# Patient Record
Sex: Male | Born: 1955 | Race: White | Hispanic: No | Marital: Married | Smoking: Former smoker
Health system: Southern US, Community
[De-identification: ages and names within clinical notes are randomized; demographics above are authoritative.]

## PROBLEM LIST (undated history)

## (undated) DIAGNOSIS — Z87442 Personal history of urinary calculi: Secondary | ICD-10-CM

## (undated) DIAGNOSIS — Z720 Tobacco use: Secondary | ICD-10-CM

## (undated) DIAGNOSIS — R51 Headache: Secondary | ICD-10-CM

## (undated) DIAGNOSIS — I2510 Atherosclerotic heart disease of native coronary artery without angina pectoris: Secondary | ICD-10-CM

## (undated) DIAGNOSIS — E039 Hypothyroidism, unspecified: Secondary | ICD-10-CM

## (undated) DIAGNOSIS — E785 Hyperlipidemia, unspecified: Secondary | ICD-10-CM

## (undated) DIAGNOSIS — R519 Headache, unspecified: Secondary | ICD-10-CM

## (undated) HISTORY — PX: LIPOMA EXCISION: SHX5283

## (undated) HISTORY — PX: APPENDECTOMY: SHX54

## (undated) HISTORY — DX: Tobacco use: Z72.0

## (undated) HISTORY — DX: Hyperlipidemia, unspecified: E78.5

## (undated) HISTORY — DX: Atherosclerotic heart disease of native coronary artery without angina pectoris: I25.10

---

## 1999-06-13 ENCOUNTER — Emergency Department (HOSPITAL_COMMUNITY): Admission: EM | Admit: 1999-06-13 | Discharge: 1999-06-13 | Payer: Self-pay | Admitting: Emergency Medicine

## 1999-06-13 ENCOUNTER — Encounter: Payer: Self-pay | Admitting: Emergency Medicine

## 2000-04-10 ENCOUNTER — Ambulatory Visit (HOSPITAL_BASED_OUTPATIENT_CLINIC_OR_DEPARTMENT_OTHER): Admission: RE | Admit: 2000-04-10 | Discharge: 2000-04-10 | Payer: Self-pay | Admitting: General Surgery

## 2001-06-29 ENCOUNTER — Encounter: Payer: Self-pay | Admitting: Orthopedic Surgery

## 2001-06-29 ENCOUNTER — Ambulatory Visit (HOSPITAL_COMMUNITY): Admission: RE | Admit: 2001-06-29 | Discharge: 2001-06-29 | Payer: Self-pay | Admitting: Orthopedic Surgery

## 2005-06-27 ENCOUNTER — Encounter: Admission: RE | Admit: 2005-06-27 | Discharge: 2005-06-27 | Payer: Self-pay | Admitting: Surgery

## 2005-06-30 ENCOUNTER — Ambulatory Visit (HOSPITAL_BASED_OUTPATIENT_CLINIC_OR_DEPARTMENT_OTHER): Admission: RE | Admit: 2005-06-30 | Discharge: 2005-06-30 | Payer: Self-pay | Admitting: Surgery

## 2005-06-30 ENCOUNTER — Ambulatory Visit (HOSPITAL_COMMUNITY): Admission: RE | Admit: 2005-06-30 | Discharge: 2005-06-30 | Payer: Self-pay | Admitting: Surgery

## 2013-04-07 ENCOUNTER — Encounter (HOSPITAL_COMMUNITY): Payer: Self-pay | Admitting: *Deleted

## 2013-04-07 ENCOUNTER — Emergency Department (HOSPITAL_COMMUNITY)
Admission: EM | Admit: 2013-04-07 | Discharge: 2013-04-07 | Disposition: A | Discharge status: Home / Self Care | Payer: Self-pay | Attending: Emergency Medicine | Admitting: Emergency Medicine

## 2013-04-07 DIAGNOSIS — L03119 Cellulitis of unspecified part of limb: Secondary | ICD-10-CM | POA: Insufficient documentation

## 2013-04-07 DIAGNOSIS — F17200 Nicotine dependence, unspecified, uncomplicated: Secondary | ICD-10-CM | POA: Insufficient documentation

## 2013-04-07 DIAGNOSIS — Y929 Unspecified place or not applicable: Secondary | ICD-10-CM | POA: Insufficient documentation

## 2013-04-07 DIAGNOSIS — Z91040 Latex allergy status: Secondary | ICD-10-CM | POA: Insufficient documentation

## 2013-04-07 DIAGNOSIS — W57XXXA Bitten or stung by nonvenomous insect and other nonvenomous arthropods, initial encounter: Secondary | ICD-10-CM

## 2013-04-07 DIAGNOSIS — Y939 Activity, unspecified: Secondary | ICD-10-CM | POA: Insufficient documentation

## 2013-04-07 DIAGNOSIS — Z79899 Other long term (current) drug therapy: Secondary | ICD-10-CM | POA: Insufficient documentation

## 2013-04-07 DIAGNOSIS — L02419 Cutaneous abscess of limb, unspecified: Secondary | ICD-10-CM | POA: Insufficient documentation

## 2013-04-07 DIAGNOSIS — S90569A Insect bite (nonvenomous), unspecified ankle, initial encounter: Secondary | ICD-10-CM | POA: Insufficient documentation

## 2013-04-07 MED ORDER — DOXYCYCLINE HYCLATE 100 MG PO CAPS: 100.0000 mg | ORAL_CAPSULE | Freq: Two times a day (BID) | ORAL | Status: DC

## 2013-04-07 MED ORDER — DOXYCYCLINE HYCLATE 100 MG PO TABS
100.0000 mg | ORAL_TABLET | Freq: Once | ORAL | Status: AC
  Administered 2013-04-07: 100 mg via ORAL
  Filled 2013-04-07: qty 1

## 2013-04-07 NOTE — ED Notes (Signed)
Pt states he has an insect bit to the left foot, noticed about 1 week ago & has gotten worse.

## 2013-04-07 NOTE — ED Provider Notes (Signed)
History    This chart was scribed for Bruce Nielsen, MD by Marlyne Beards, ED Scribe. The patient was seen in room APA02/APA02. Patient's care was started at 11:03 PM.    CSN: [id number]  Arrival date & time 04/07/13  2133   First MD Initiated Contact with Patient 04/07/13 2303      Chief Complaint  Patient presents with  . Abscess  . Insect Bite    (Consider location/radiation/quality/duration/timing/severity/associated sxs/prior treatment) The history is provided by the patient. No language interpreter was used.   HPI Comments: Bruce Mcgee is a 57 y.o. male who presents to the Emergency Department complaining of an insect bite on his left ankle that occurred about a week ago. Pt states that he was out on the patio when he believes something bit him. Pt states that nothing was evident until this Tuesday which it was the size of about a penny. The affected area on his left ankle has gotten progressively bigger/worse. Pt states that his shoe might have caused the area to get bigger. He states that wearing his boots seems to be the only thing that exacerbates irritation. Pt denies any trouble breathing/swallowing, fever, chills, cough, nausea, vomiting, diarrhea, SOB, weakness, and any other associated symptoms.   History reviewed. No pertinent past medical history.  Past Surgical History  Procedure Laterality Date  . Appendectomy      No family history on file.  History  Substance Use Topics  . Smoking status: Current Every Day Smoker -- 2.00 packs/day    Types: Cigarettes  . Smokeless tobacco: Not on file  . Alcohol Use: No      Review of Systems  Skin: Positive for rash.  All other systems reviewed and are negative.    Allergies  Latex  Home Medications   Current Outpatient Rx  Name  Route  Sig  Dispense  Refill  . LEVOTHYROXINE SODIUM PO   Oral   Take 1 tablet by mouth daily.           BP 135/80  Pulse 80  Temp(Src) 97.8 F (36.6 C) (Oral)  Resp 18   Ht 5\' 9"  (1.753 m)  Wt 167 lb (75.751 kg)  BMI 24.65 kg/m2  SpO2 99%  Physical Exam  Nursing note and vitals reviewed. Constitutional: He is oriented to person, place, and time. He appears well-developed and well-nourished. No distress.  HENT:  Head: Normocephalic and atraumatic.  Eyes: EOM are normal.  Neck: Neck supple. No tracheal deviation present.  Cardiovascular: Normal rate, regular rhythm, normal heart sounds and intact distal pulses.   Pulmonary/Chest: Effort normal. No respiratory distress.  Musculoskeletal: Normal range of motion.  Neurological: He is alert and oriented to person, place, and time. He has normal reflexes. No cranial nerve deficit. He exhibits normal muscle tone. Coordination normal.  Skin: Skin is warm and dry. Rash noted. There is erythema.  Left foot dorsal aspect with Centralized area of erythema with surrounding ring of erythema but no increased warmth. No fluctuance.   Psychiatric: He has a normal mood and affect. His behavior is normal.    ED Course  Procedures (including critical care time) DIAGNOSTIC STUDIES: Oxygen Saturation is 99% on room air , normal by my interpretation.    COORDINATION OF CARE: 11:09 PM Discussed ED treatment with pt and pt agrees. Prescribed pt with Doxycycline.   Infection precautions verbalized as understood  MDM  Left foot erythema with centralized puncture wound, possible cellulitis, no fluctuance. RX provided. VS and  nursing notes reviewed and considered. No systemic symptoms.   I personally performed the services described in this documentation, which was scribed in my presence. The recorded information has been reviewed and is accurate.        Bruce Nielsen, MD 04/08/13 [phone]

## 2018-03-05 ENCOUNTER — Other Ambulatory Visit: Payer: Self-pay | Admitting: General Surgery

## 2018-03-05 DIAGNOSIS — K432 Incisional hernia without obstruction or gangrene: Secondary | ICD-10-CM

## 2018-03-07 DIAGNOSIS — Z79899 Other long term (current) drug therapy: Secondary | ICD-10-CM | POA: Diagnosis not present

## 2018-03-07 DIAGNOSIS — K432 Incisional hernia without obstruction or gangrene: Principal | ICD-10-CM | POA: Insufficient documentation

## 2018-03-07 DIAGNOSIS — R1032 Left lower quadrant pain: Secondary | ICD-10-CM | POA: Diagnosis present

## 2018-03-07 DIAGNOSIS — F17210 Nicotine dependence, cigarettes, uncomplicated: Secondary | ICD-10-CM | POA: Diagnosis not present

## 2018-03-07 DIAGNOSIS — Z91040 Latex allergy status: Secondary | ICD-10-CM | POA: Diagnosis not present

## 2018-03-07 DIAGNOSIS — D72829 Elevated white blood cell count, unspecified: Secondary | ICD-10-CM | POA: Diagnosis not present

## 2018-03-08 ENCOUNTER — Other Ambulatory Visit: Payer: Self-pay

## 2018-03-08 ENCOUNTER — Emergency Department (HOSPITAL_COMMUNITY): Payer: 59

## 2018-03-08 ENCOUNTER — Observation Stay (HOSPITAL_COMMUNITY)
Admission: EM | Admit: 2018-03-08 | Discharge: 2018-03-09 | Disposition: A | Discharge status: Home / Self Care | Payer: 59 | Attending: General Surgery | Admitting: General Surgery

## 2018-03-08 ENCOUNTER — Encounter (HOSPITAL_COMMUNITY): Payer: Self-pay | Admitting: *Deleted

## 2018-03-08 DIAGNOSIS — K432 Incisional hernia without obstruction or gangrene: Secondary | ICD-10-CM | POA: Diagnosis present

## 2018-03-08 DIAGNOSIS — K439 Ventral hernia without obstruction or gangrene: Secondary | ICD-10-CM

## 2018-03-08 DIAGNOSIS — D72829 Elevated white blood cell count, unspecified: Secondary | ICD-10-CM

## 2018-03-08 LAB — CBC WITH DIFFERENTIAL/PLATELET
BASOS PCT: 0 %
Basophils Absolute: 0 10*3/uL (ref 0.0–0.1)
EOS ABS: 0 10*3/uL (ref 0.0–0.7)
Eosinophils Relative: 0 %
HEMATOCRIT: 47.6 % (ref 39.0–52.0)
HEMOGLOBIN: 16.1 g/dL (ref 13.0–17.0)
LYMPHS ABS: 2.9 10*3/uL (ref 0.7–4.0)
Lymphocytes Relative: 13 %
MCH: 30.3 pg (ref 26.0–34.0)
MCHC: 33.8 g/dL (ref 30.0–36.0)
MCV: 89.6 fL (ref 78.0–100.0)
Monocytes Absolute: 1.4 10*3/uL (ref 0.1–1.0)
Monocytes Relative: 6 %
NEUTROS ABS: 18.4 10*3/uL (ref 1.7–7.7)
NEUTROS PCT: 81 %
Platelets: 272 10*3/uL (ref 150–400)
RBC: 5.31 MIL/uL (ref 4.22–5.81)
RDW: 14.1 % (ref 11.5–15.5)
WBC: 22.8 10*3/uL — AB (ref 4.0–10.5)

## 2018-03-08 LAB — COMPREHENSIVE METABOLIC PANEL
ALT: 29 U/L (ref 17–63)
ANION GAP: 13 (ref 5–15)
AST: 18 U/L (ref 15–41)
Albumin: 3.6 g/dL (ref 3.5–5.0)
Alkaline Phosphatase: 85 U/L (ref 38–126)
BUN: 21 mg/dL — ABNORMAL HIGH (ref 6–20)
CHLORIDE: 107 mmol/L (ref 101–111)
CO2: 24 mmol/L (ref 22–32)
CREATININE: 1.09 mg/dL (ref 0.61–1.24)
Calcium: 8.7 mg/dL — ABNORMAL LOW (ref 8.9–10.3)
Glucose, Bld: 166 mg/dL — ABNORMAL HIGH (ref 65–99)
POTASSIUM: 3.9 mmol/L (ref 3.5–5.1)
Sodium: 144 mmol/L (ref 135–145)
Total Bilirubin: 0.6 mg/dL (ref 0.3–1.2)
Total Protein: 6.8 g/dL (ref 6.5–8.1)

## 2018-03-08 LAB — URINALYSIS, ROUTINE W REFLEX MICROSCOPIC
BILIRUBIN URINE: NEGATIVE
Glucose, UA: NEGATIVE mg/dL
Hgb urine dipstick: NEGATIVE
Ketones, ur: NEGATIVE mg/dL
LEUKOCYTES UA: NEGATIVE
NITRITE: NEGATIVE
PH: 5 (ref 5.0–8.0)
Protein, ur: NEGATIVE mg/dL
Specific Gravity, Urine: 1.031 — ABNORMAL HIGH (ref 1.005–1.030)

## 2018-03-08 LAB — I-STAT CG4 LACTIC ACID, ED: LACTIC ACID, VENOUS: 1.24 mmol/L (ref 0.5–1.9)

## 2018-03-08 LAB — LIPASE, BLOOD: LIPASE: 28 U/L (ref 11–51)

## 2018-03-08 MED ORDER — ACETAMINOPHEN 325 MG PO TABS: 650.0000 mg | ORAL_TABLET | Freq: Four times a day (QID) | ORAL | Status: DC | PRN

## 2018-03-08 MED ORDER — SODIUM CHLORIDE 0.9 % IV BOLUS
1000.0000 mL | Freq: Once | INTRAVENOUS | Status: AC
  Administered 2018-03-08: 1000 mL via INTRAVENOUS

## 2018-03-08 MED ORDER — LEVOTHYROXINE SODIUM 100 MCG PO TABS
100.0000 ug | ORAL_TABLET | Freq: Every day | ORAL | Status: DC
  Administered 2018-03-08: 100 ug via ORAL
  Filled 2018-03-08: qty 1

## 2018-03-08 MED ORDER — ACETAMINOPHEN 650 MG RE SUPP: 650.0000 mg | Freq: Four times a day (QID) | RECTAL | Status: DC | PRN

## 2018-03-08 MED ORDER — HYDROMORPHONE HCL 1 MG/ML IJ SOLN: 1.0000 mg | INTRAMUSCULAR | Status: DC | PRN

## 2018-03-08 MED ORDER — MORPHINE SULFATE (PF) 4 MG/ML IV SOLN
4.0000 mg | Freq: Once | INTRAVENOUS | Status: AC
  Administered 2018-03-08: 4 mg via INTRAVENOUS
  Filled 2018-03-08: qty 1

## 2018-03-08 MED ORDER — LACTATED RINGERS IV SOLN
INTRAVENOUS | Status: DC
  Administered 2018-03-08: 14:00:00 via INTRAVENOUS

## 2018-03-08 MED ORDER — FENTANYL CITRATE (PF) 100 MCG/2ML IJ SOLN
50.0000 ug | Freq: Once | INTRAMUSCULAR | Status: AC
Start: 2018-03-08 — End: 2018-03-08
  Administered 2018-03-08: 50 ug via INTRAVENOUS
  Filled 2018-03-08: qty 2

## 2018-03-08 MED ORDER — ONDANSETRON HCL 4 MG/2ML IJ SOLN: 4.0000 mg | Freq: Four times a day (QID) | INTRAMUSCULAR | Status: DC | PRN

## 2018-03-08 MED ORDER — HYDROMORPHONE HCL 1 MG/ML IJ SOLN
1.0000 mg | Freq: Once | INTRAMUSCULAR | Status: AC
  Administered 2018-03-08: 1 mg via INTRAVENOUS
  Filled 2018-03-08: qty 1

## 2018-03-08 MED ORDER — ONDANSETRON HCL 4 MG/2ML IJ SOLN
4.0000 mg | Freq: Once | INTRAMUSCULAR | Status: AC
  Administered 2018-03-08: 4 mg via INTRAVENOUS
  Filled 2018-03-08: qty 2

## 2018-03-08 MED ORDER — ONDANSETRON 4 MG PO TBDP: 4.0000 mg | ORAL_TABLET | Freq: Four times a day (QID) | ORAL | Status: DC | PRN

## 2018-03-08 MED ORDER — HYDROCODONE-ACETAMINOPHEN 5-325 MG PO TABS: 1.0000 | ORAL_TABLET | ORAL | Status: DC | PRN

## 2018-03-08 MED ORDER — ONDANSETRON HCL 4 MG/2ML IJ SOLN: 4.0000 mg | Freq: Once | INTRAMUSCULAR | Status: DC

## 2018-03-08 MED ORDER — ALBUTEROL SULFATE (2.5 MG/3ML) 0.083% IN NEBU: 3.0000 mL | INHALATION_SOLUTION | RESPIRATORY_TRACT | Status: DC | PRN

## 2018-03-08 MED ORDER — NICOTINE 21 MG/24HR TD PT24
21.0000 mg | MEDICATED_PATCH | Freq: Every day | TRANSDERMAL | Status: DC
  Filled 2018-03-08: qty 1

## 2018-03-08 MED ORDER — SIMETHICONE 80 MG PO CHEW: 40.0000 mg | CHEWABLE_TABLET | Freq: Four times a day (QID) | ORAL | Status: DC | PRN

## 2018-03-08 MED ORDER — ENOXAPARIN SODIUM 40 MG/0.4ML ~~LOC~~ SOLN
40.0000 mg | SUBCUTANEOUS | Status: DC
  Administered 2018-03-08: 40 mg via SUBCUTANEOUS
  Filled 2018-03-08: qty 0.4

## 2018-03-08 MED ORDER — KETOROLAC TROMETHAMINE 30 MG/ML IJ SOLN: 30.0000 mg | Freq: Once | INTRAMUSCULAR | Status: DC

## 2018-03-08 MED ORDER — PIPERACILLIN-TAZOBACTAM 3.375 G IVPB 30 MIN
3.3750 g | Freq: Once | INTRAVENOUS | Status: AC
  Administered 2018-03-08: 3.375 g via INTRAVENOUS
  Filled 2018-03-08: qty 50

## 2018-03-08 NOTE — ED Provider Notes (Addendum)
[facility] EMERGENCY DEPARTMENT Provider Note   CSN: [id number] Arrival date & time: 03/07/18  2358     History   Chief Complaint Chief Complaint  Patient presents with  . Abdominal Pain    HPI Bruce Mcgee is a 62 y.o. male.  The history is provided by the patient.  He had onset about 10 PM of severe pain in the left lower abdomen with some radiation to the right side and radiation to the left flank.  There is associated nausea and vomiting and diaphoresis.  Pain is severe and he rated at a 10/10.  Nothing made it better, nothing made it worse.  He has had some urinary hesitancy but no dysuria and no hematuria.  He denies fever or chills.  He has never had pain like this before.  Of note, he does have a known hernia on the right side of the abdomen and pain does sometimes radiate into the area of the hernia.  He has seen a Careers adviser and is supposed to have surgical repair after he quits smoking.  History reviewed. No pertinent past medical history.  There are no active problems to display for this patient.   Past Surgical History:  Procedure Laterality Date  . APPENDECTOMY          Home Medications    Prior to Admission medications   Medication Sig Start Date End Date Taking? Authorizing Provider  doxycycline (VIBRAMYCIN) 100 MG capsule Take 1 capsule (100 mg total) by mouth 2 (two) times daily. 04/07/13   Sunnie Nielsen, MD  LEVOTHYROXINE SODIUM PO Take 1 tablet by mouth daily.    [provider]    Family History History reviewed. No pertinent family history.  Social History Social History   Tobacco Use  . Smoking status: Current Every Day Smoker    Packs/day: 2.00    Types: Cigarettes  . Smokeless tobacco: Never Used  Substance Use Topics  . Alcohol use: No  . Drug use: No     Allergies   Latex   Review of Systems Review of Systems  All other systems reviewed and are negative.    Physical Exam Updated Vital Signs BP 137/87 (BP  Location: Right Arm)   Pulse 61   Temp 98.1 F (36.7 C) (Oral)   Resp 18   Ht  (1.753 m)   Wt 81.6 kg (180 lb)   SpO2 98%   BMI 26.58 kg/m   Physical Exam  Nursing note and vitals reviewed.  62 year old male, resting comfortably and in no acute distress. Vital signs are normal. Oxygen saturation is 98%, which is normal. Head is normocephalic and atraumatic. PERRLA, EOMI. Oropharynx is clear. Neck is nontender and supple without adenopathy or JVD. Back is nontender in the midline.  There is mild bilateral CVA tenderness. Lungs are clear without rales, wheezes, or rhonchi. Chest is nontender. Heart has regular rate and rhythm without murmur. Abdomen is soft, flat, with moderate tenderness in the left lower quadrant without rebound or guarding.  There is a moderate size abdominal wall hernia in the right midabdomen which is reducible, but does have moderate tenderness.  There are no other masses or hepatosplenomegaly and peristalsis is hypoactive. Extremities have no cyanosis or edema, full range of motion is present. Skin is warm and dry without rash. Neurologic: Mental status is normal, cranial nerves are intact, there are no motor or sensory deficits.  ED Treatments / Results  Labs (all labs ordered are  listed, but only abnormal results are displayed) Labs Reviewed  COMPREHENSIVE METABOLIC PANEL - Abnormal; Notable for the following components:      Result Value   Glucose, Bld 166 (*)    BUN 21 (*)    Calcium 8.7 (*)    All other components within normal limits  URINALYSIS, ROUTINE W REFLEX MICROSCOPIC - Abnormal; Notable for the following components:   Specific Gravity, Urine 1.031 (*)    All other components within normal limits  CBC WITH DIFFERENTIAL/PLATELET - Abnormal; Notable for the following components:   WBC 22.8 (*)    All other components within normal limits  LIPASE, BLOOD  I-STAT CG4 LACTIC ACID, ED  I-STAT CG4 LACTIC ACID, ED   Radiology Ct Renal  Stone Study  Result Date: 03/08/2018 CLINICAL DATA:  Sudden onset nausea and vomiting with left-sided abdominal pain starting tonight. EXAM: CT ABDOMEN AND PELVIS WITHOUT CONTRAST TECHNIQUE: Multidetector CT imaging of the abdomen and pelvis was performed following the standard protocol without IV contrast. COMPARISON:  07/11/2008 FINDINGS: Lower chest: No acute abnormality. Hepatobiliary: No focal liver abnormality is seen. No gallstones, gallbladder wall thickening, or biliary dilatation. Pancreas: Unremarkable. No pancreatic ductal dilatation or surrounding inflammatory changes. Spleen: Normal in size without focal abnormality. Adrenals/Urinary Tract: No adrenal gland nodules. Punctate sized stones in the right kidney. No hydronephrosis or hydroureter. No ureteral stones or bladder stones. No bladder wall thickening. Stomach/Bowel: There is a large right anterior abdominal wall hernia arising through the junction between the rectus abdominus and right flank musculature. There is herniation of right colon, small bowel, and fat. There is prominent fat stranding and vascular engorgement which may indicate fat necrosis and vascular compromise. There is no evidence of proximal obstruction. Stomach, small bowel, and colon are all decompressed. Appendix is not identified but is likely within the hernia. Vascular/Lymphatic: Aortic atherosclerosis. No enlarged abdominal or pelvic lymph nodes. Reproductive: Prostate is unremarkable. Other: No free air or free fluid in the abdomen. Musculoskeletal: No acute or significant osseous findings. IMPRESSION: 1. Large right anterolateral hernia arising between the rectus abdominus and right flank muscles. There is herniation of large amounts of small bowel, right colon, and fat. Fat stranding and vascular engorgement suggests fat necrosis and vascular compromise. No bowel wall thickening or obstruction. 2. Tiny nonobstructing stones in the right kidney. No ureteral stone or  obstruction. Electronically Signed   By: Burman Nieves M.D.   On: 03/08/2018 02:30    Procedures Procedures   Medications Ordered in ED Medications  piperacillin-tazobactam (ZOSYN) IVPB 3.375 g (has no administration in time range)  ondansetron (ZOFRAN) injection 4 mg (4 mg Intravenous Given 03/08/18 0036)  fentaNYL (SUBLIMAZE) injection 50 mcg (50 mcg Intravenous Given 03/08/18 0114)  sodium chloride 0.9 % bolus 1,000 mL (0 mLs Intravenous Stopped 03/08/18 0323)  morphine 4 MG/ML injection 4 mg (4 mg Intravenous Given 03/08/18 0134)  morphine 4 MG/ML injection 4 mg (4 mg Intravenous Given 03/08/18 0206)  HYDROmorphone (DILAUDID) injection 1 mg (1 mg Intravenous Given 03/08/18 0323)     Initial Impression / Assessment and Plan / ED Course  I have reviewed the triage vital signs and the nursing notes.  Pertinent labs & imaging results that were available during my care of the patient were reviewed by me and considered in my medical decision making (see chart for details).  Abdominal and flank pain worrisome for urolithiasis.  Other diagnostic possibilities include diverticulitis, urinary tract infection.  Abdominal wall hernia without signs of incarceration or  strangulation.  Old records are reviewed, and he has no relevant past visits.  He will be sent for renal stone protocol CT scan.  Other screening labs are also obtained.  He had received a dose of fentanyl with good relief of pain, but pain is recurring.  He will be given morphine.  He required several doses of morphine and continued to have pain.  He is given hydromorphone with good relief of pain.  CT scan shows no evidence of urolithiasis, but large right-sided hernia with fat stranding and vascular engorgement suggesting fat necrosis and vascular compromise.  Lactic acid was obtained and was normal, so there is no evidence of acute ischemic bowel.  Case is discussed with Dr. Lovell Sheehan who agrees to come to evaluate the patient.  Dr.  Lovell Sheehan has seen the patient and agrees that he needs somewhat urgent repair of his hernia, but does not feel that it can be done safely here.  He had seen a Development worker, international aid at [facility] surgery 3 days ago, and they will be contacted to see if they would be willing to accept the patient in transfer.  If there unable to take the patient in transfer, he will need to be transferred to [facility].  Care is signed out to Dr. Jodi Mourning at this point.  Although this does not appear to be an infectious event, it is felt prudent to start him on antibiotics and he is given a dose of intravenous Zosyn.  Final Clinical Impressions(s) / ED Diagnoses   Final diagnoses:  Abdominal wall hernia  Leukocytosis, unspecified type    ED Discharge Orders    None       Dione Booze, MD 03/08/18 [phone]   Case has been discussed with Dr. Harlon Flor of [facility] surgery who states that there are no beds available at [facility], and recommends patient be transferred to [facility].   Dione Booze, MD 03/08/18 [phone]

## 2018-03-08 NOTE — ED Triage Notes (Signed)
Pt brought in by [facility] for c/o left side abdominal pain that started x 3 hours ago; pt states he has been vomiting

## 2018-03-08 NOTE — H&P (Signed)
Reason for Consult: Incisional hernia Referring Physician: Dr. Domingo Madeira is an 62 y.o. male.  HPI: Patient is a 62 year old white male who presented to the emergency room with worsening abdominal pain and nausea.  He denies any emesis.  He denies any fever or chills.  He started having swelling and pain along the right side of the abdomen over the known ventral hernia.  Is been present for approximately 10 years.  He is status post an open appendectomy in the remote past.  He was seen by a surgeon at [facility] 3 days ago and a CT scan of the abdomen was ordered.  Due to worsening pain, the patient presented to the emergency room.  Patient was noted to have a significant leukocytosis.  His pain was intermittently controlled with IV pain medication.  CT scan of the abdomen pelvis revealed small bowel in the right colon to be within the hernia.  The hernia was reduced by the emergency room physician, but the patient has continued to have intermittent pain.  The hernia bulges back out soon after reduction.  Patient has a pain level of 8 out of 10 when the hernia sticks out.  He is currently taking prednisone for allergies.  History reviewed. No pertinent past medical history.  Past Surgical History:  Procedure Laterality Date  . APPENDECTOMY      History reviewed. No pertinent family history.  Social History:  reports that he has been smoking cigarettes.  He has been smoking about 2.00 packs per day. He has never used smokeless tobacco. He reports that he does not drink alcohol or use drugs.  Allergies:  Allergies  Allergen Reactions  . Latex     Medications: I have reviewed the patient's current medications.  Results for orders placed or performed during the hospital encounter of 03/08/18 (from the past 48 hour(s))  Urinalysis, Routine w reflex microscopic     Status: Abnormal   Collection Time: 03/08/18 12:11 AM  Result Value Ref Range   Color, Urine YELLOW  YELLOW   APPearance CLEAR CLEAR   Specific Gravity, Urine 1.031 (H) 1.005 - 1.030   pH 5.0 5.0 - 8.0   Glucose, UA NEGATIVE NEGATIVE mg/dL   Hgb urine dipstick NEGATIVE NEGATIVE   Bilirubin Urine NEGATIVE NEGATIVE   Ketones, ur NEGATIVE NEGATIVE mg/dL   Protein, ur NEGATIVE NEGATIVE mg/dL   Nitrite NEGATIVE NEGATIVE   Leukocytes, UA NEGATIVE NEGATIVE    Comment: Performed at [facility], [address] [place], [place] [address]  Lipase, blood     Status: None   Collection Time: 03/08/18 12:52 AM  Result Value Ref Range   Lipase 28 11 - 51 U/L    Comment: Performed at [facility], [address], [place],  [address]  Comprehensive metabolic panel     Status: Abnormal   Collection Time: 03/08/18 12:52 AM  Result Value Ref Range   Sodium 144 135 - 145 mmol/L   Potassium 3.9 3.5 - 5.1 mmol/L   Chloride 107 101 - 111 mmol/L   CO2 24 22 - 32 mmol/L   Glucose, Bld 166 (H) 65 - 99 mg/dL   BUN 21 (H) 6 - 20 mg/dL   Creatinine, Ser 1.09 0.61 - 1.24 mg/dL   Calcium 8.7 (L) 8.9 - 10.3 mg/dL   Total Protein 6.8 6.5 - 8.1 g/dL   Albumin 3.6 3.5 - 5.0 g/dL   AST 18 15 - 41 U/L   ALT 29 17 -  63 U/L   Alkaline Phosphatase 85 38 - 126 U/L   Total Bilirubin 0.6 0.3 - 1.2 mg/dL   GFR calc non Af Amer >60 >60 mL/min   GFR calc Af Amer >60 >60 mL/min    Comment: (NOTE) The eGFR has been calculated using the CKD EPI equation. This calculation has not been validated in all clinical situations. eGFR's persistently <60 mL/min signify possible Chronic Kidney Disease.    Anion gap 13 5 - 15    Comment: Performed at [facility], [address], [place], [place] [address]  CBC with Differential     Status: Abnormal   Collection Time: 03/08/18 12:52 AM  Result Value Ref Range   WBC 22.8 (H) 4.0 - 10.5 K/uL   RBC 5.31 4.22 - 5.81 MIL/uL   Hemoglobin 16.1 13.0 - 17.0 g/dL   HCT 47.6 39.0 - 52.0 %   MCV 89.6 78.0 - 100.0 fL   MCH 30.3 26.0 - 34.0 pg   MCHC 33.8 30.0 - 36.0 g/dL   RDW  14.1 11.5 - 15.5 %   Platelets 272 150 - 400 K/uL   Neutrophils Relative % 81 %   Neutro Abs 18.4 1.7 - 7.7 K/uL   Lymphocytes Relative 13 %   Lymphs Abs 2.9 0.7 - 4.0 K/uL   Monocytes Relative 6 %   Monocytes Absolute 1.4 0.1 - 1.0 K/uL   Eosinophils Relative 0 %   Eosinophils Absolute 0.0 0.0 - 0.7 K/uL   Basophils Relative 0 %   Basophils Absolute 0.0 0.0 - 0.1 K/uL    Comment: Performed at [facility], [address], [place], [place] [address]  I-Stat CG4 Lactic Acid, ED     Status: None   Collection Time: 03/08/18  2:50 AM  Result Value Ref Range   Lactic Acid, Venous 1.24 0.5 - 1.9 mmol/L    Ct Renal Stone Study  Result Date: 03/08/2018 CLINICAL DATA:  Sudden onset nausea and vomiting with left-sided abdominal pain starting tonight. EXAM: CT ABDOMEN AND PELVIS WITHOUT CONTRAST TECHNIQUE: Multidetector CT imaging of the abdomen and pelvis was performed following the standard protocol without IV contrast. COMPARISON:  07/11/2008 FINDINGS: Lower chest: No acute abnormality. Hepatobiliary: No focal liver abnormality is seen. No gallstones, gallbladder wall thickening, or biliary dilatation. Pancreas: Unremarkable. No pancreatic ductal dilatation or surrounding inflammatory changes. Spleen: Normal in size without focal abnormality. Adrenals/Urinary Tract: No adrenal gland nodules. Punctate sized stones in the right kidney. No hydronephrosis or hydroureter. No ureteral stones or bladder stones. No bladder wall thickening. Stomach/Bowel: There is a large right anterior abdominal wall hernia arising through the junction between the rectus abdominus and right flank musculature. There is herniation of right colon, small bowel, and fat. There is prominent fat stranding and vascular engorgement which may indicate fat necrosis and vascular compromise. There is no evidence of proximal obstruction. Stomach, small bowel, and colon are all decompressed. Appendix is not identified but is likely within the  hernia. Vascular/Lymphatic: Aortic atherosclerosis. No enlarged abdominal or pelvic lymph nodes. Reproductive: Prostate is unremarkable. Other: No free air or free fluid in the abdomen. Musculoskeletal: No acute or significant osseous findings. IMPRESSION: 1. Large right anterolateral hernia arising between the rectus abdominus and right flank muscles. There is herniation of large amounts of small bowel, right colon, and fat. Fat stranding and vascular engorgement suggests fat necrosis and vascular compromise. No bowel wall thickening or obstruction. 2. Tiny nonobstructing stones in the right kidney. No ureteral stone or obstruction.  Electronically Signed   By: Lucienne Capers M.D.   On: 03/08/2018 02:30    ROS:  Pertinent items are noted in HPI.  Blood pressure (!) 142/78, pulse 84, temperature 98.2 F (36.8 C), temperature source Oral, resp. rate 18, height 5' 9"  (1.753 m), weight 180 lb (81.6 kg), SpO2 92 %. Physical Exam: Pleasant well-developed well-nourished white male no acute distress Head is normocephalic, atraumatic Lungs are clear to auscultation with equal breath sounds bilaterally.  No wheezing is noted. Heart examination reveals regular rate and rhythm without S3, S4, murmurs The abdomen is soft with a transverse right lower quadrant surgical scar present which is indented.  A large reducible hernia is present.  No rigidity is noted.  CT scan images personally reviewed  Assessment/Plan: Impression: Incisional hernia.  It is concerning that the patient's white blood cell count is elevated.  The does not seem to be evidence of strangulation, though there was some venous congestion on the CT scan.  This is a complex hernia.  I do not think this is a hernia that can be repaired at [facility].  As patient just saw [facility] several days ago, Dr. Roxanne Mins will be contacting them.  Aviva Signs 03/08/2018, 8:47 AM   Addendum:  Both [facility] and [facility] hospitals have  been contacted and are full and cannot accept the patient in transfer.  We will bring the patient into the hospital for observation status due to the leukocytosis.  If he does fine over the next 24 hours, he will be discharged and set up for outpatient visits.

## 2018-03-08 NOTE — ED Notes (Signed)
Checked with pt for urine sample,pt states can't go right now will try back in 30 minutes. 

## 2018-03-08 NOTE — ED Notes (Addendum)
Pt c/o sudden onset of n/v, left side abd and left side flank pain that started tonight, denies any diarrhea, denies any exposure to illness.

## 2018-03-08 NOTE — ED Notes (Signed)
Pt states that his pain is starting to come back stronger again, pt requesting additional pain medication Dr Preston Fleeting notified,

## 2018-03-08 NOTE — ED Notes (Signed)
Pt requesting something for nausea, zofran given per protocol.

## 2018-03-08 NOTE — ED Notes (Signed)
Pt ambulated to BR

## 2018-03-08 NOTE — Consult Note (Addendum)
Reason for Consult: Incisional hernia Referring Physician: Dr. Domingo Madeira is an 62 y.o. male.  HPI: Patient is a 62 year old white male who presented to the emergency room with worsening abdominal pain and nausea.  He denies any emesis.  He denies any fever or chills.  He started having swelling and pain along the right side of the abdomen over the known ventral hernia.  Is been present for approximately 10 years.  He is status post an open appendectomy in the remote past.  He was seen by a surgeon at [facility] 3 days ago and a CT scan of the abdomen was ordered.  Due to worsening pain, the patient presented to the emergency room.  Patient was noted to have a significant leukocytosis.  His pain was intermittently controlled with IV pain medication.  CT scan of the abdomen pelvis revealed small bowel in the right colon to be within the hernia.  The hernia was reduced by the emergency room physician, but the patient has continued to have intermittent pain.  The hernia bulges back out soon after reduction.  Patient has a pain level of 8 out of 10 when the hernia sticks out.  He is currently taking prednisone for allergies.  History reviewed. No pertinent past medical history.  Past Surgical History:  Procedure Laterality Date  . APPENDECTOMY      History reviewed. No pertinent family history.  Social History:  reports that he has been smoking cigarettes.  He has been smoking about 2.00 packs per day. He has never used smokeless tobacco. He reports that he does not drink alcohol or use drugs.  Allergies:  Allergies  Allergen Reactions  . Latex     Medications: I have reviewed the patient's current medications.  Results for orders placed or performed during the hospital encounter of 03/08/18 (from the past 48 hour(s))  Urinalysis, Routine w reflex microscopic     Status: Abnormal   Collection Time: 03/08/18 12:11 AM  Result Value Ref Range   Color, Urine YELLOW  YELLOW   APPearance CLEAR CLEAR   Specific Gravity, Urine 1.031 (H) 1.005 - 1.030   pH 5.0 5.0 - 8.0   Glucose, UA NEGATIVE NEGATIVE mg/dL   Hgb urine dipstick NEGATIVE NEGATIVE   Bilirubin Urine NEGATIVE NEGATIVE   Ketones, ur NEGATIVE NEGATIVE mg/dL   Protein, ur NEGATIVE NEGATIVE mg/dL   Nitrite NEGATIVE NEGATIVE   Leukocytes, UA NEGATIVE NEGATIVE    Comment: Performed at [facility], [address], [place], [place] [address]  Lipase, blood     Status: None   Collection Time: 03/08/18 12:52 AM  Result Value Ref Range   Lipase 28 11 - 51 U/L    Comment: Performed at [facility], [address], [place], [place] [address]  Comprehensive metabolic panel     Status: Abnormal   Collection Time: 03/08/18 12:52 AM  Result Value Ref Range   Sodium 144 135 - 145 mmol/L   Potassium 3.9 3.5 - 5.1 mmol/L   Chloride 107 101 - 111 mmol/L   CO2 24 22 - 32 mmol/L   Glucose, Bld 166 (H) 65 - 99 mg/dL   BUN 21 (H) 6 - 20 mg/dL   Creatinine, Ser 1.09 0.61 - 1.24 mg/dL   Calcium 8.7 (L) 8.9 - 10.3 mg/dL   Total Protein 6.8 6.5 - 8.1 g/dL   Albumin 3.6 3.5 - 5.0 g/dL   AST 18 15 - 41 U/L   ALT 29 17 -  63 U/L   Alkaline Phosphatase 85 38 - 126 U/L   Total Bilirubin 0.6 0.3 - 1.2 mg/dL   GFR calc non Af Amer >60 >60 mL/min   GFR calc Af Amer >60 >60 mL/min    Comment: (NOTE) The eGFR has been calculated using the CKD EPI equation. This calculation has not been validated in all clinical situations. eGFR's persistently <60 mL/min signify possible Chronic Kidney Disease.    Anion gap 13 5 - 15    Comment: Performed at [facility], [address] [place], [place] [address]  CBC with Differential     Status: Abnormal   Collection Time: 03/08/18 12:52 AM  Result Value Ref Range   WBC 22.8 (H) 4.0 - 10.5 K/uL   RBC 5.31 4.22 - 5.81 MIL/uL   Hemoglobin 16.1 13.0 - 17.0 g/dL   HCT 47.6 39.0 - 52.0 %   MCV 89.6 78.0 - 100.0 fL   MCH 30.3 26.0 - 34.0 pg   MCHC 33.8 30.0 - 36.0 g/dL   RDW  14.1 11.5 - 15.5 %   Platelets 272 150 - 400 K/uL   Neutrophils Relative % 81 %   Neutro Abs 18.4 1.7 - 7.7 K/uL   Lymphocytes Relative 13 %   Lymphs Abs 2.9 0.7 - 4.0 K/uL   Monocytes Relative 6 %   Monocytes Absolute 1.4 0.1 - 1.0 K/uL   Eosinophils Relative 0 %   Eosinophils Absolute 0.0 0.0 - 0.7 K/uL   Basophils Relative 0 %   Basophils Absolute 0.0 0.0 - 0.1 K/uL    Comment: Performed at [facility], [address] [place], [place] [address]  I-Stat CG4 Lactic Acid, ED     Status: None   Collection Time: 03/08/18  2:50 AM  Result Value Ref Range   Lactic Acid, Venous 1.24 0.5 - 1.9 mmol/L    Ct Renal Stone Study  Result Date: 03/08/2018 CLINICAL DATA:  Sudden onset nausea and vomiting with left-sided abdominal pain starting tonight. EXAM: CT ABDOMEN AND PELVIS WITHOUT CONTRAST TECHNIQUE: Multidetector CT imaging of the abdomen and pelvis was performed following the standard protocol without IV contrast. COMPARISON:  07/11/2008 FINDINGS: Lower chest: No acute abnormality. Hepatobiliary: No focal liver abnormality is seen. No gallstones, gallbladder wall thickening, or biliary dilatation. Pancreas: Unremarkable. No pancreatic ductal dilatation or surrounding inflammatory changes. Spleen: Normal in size without focal abnormality. Adrenals/Urinary Tract: No adrenal gland nodules. Punctate sized stones in the right kidney. No hydronephrosis or hydroureter. No ureteral stones or bladder stones. No bladder wall thickening. Stomach/Bowel: There is a large right anterior abdominal wall hernia arising through the junction between the rectus abdominus and right flank musculature. There is herniation of right colon, small bowel, and fat. There is prominent fat stranding and vascular engorgement which may indicate fat necrosis and vascular compromise. There is no evidence of proximal obstruction. Stomach, small bowel, and colon are all decompressed. Appendix is not identified but is likely within the  hernia. Vascular/Lymphatic: Aortic atherosclerosis. No enlarged abdominal or pelvic lymph nodes. Reproductive: Prostate is unremarkable. Other: No free air or free fluid in the abdomen. Musculoskeletal: No acute or significant osseous findings. IMPRESSION: 1. Large right anterolateral hernia arising between the rectus abdominus and right flank muscles. There is herniation of large amounts of small bowel, right colon, and fat. Fat stranding and vascular engorgement suggests fat necrosis and vascular compromise. No bowel wall thickening or obstruction. 2. Tiny nonobstructing stones in the right kidney. No ureteral stone or obstruction.  Electronically Signed   By: Lucienne Capers M.D.   On: 03/08/2018 02:30    ROS:  Pertinent items are noted in HPI.  Blood pressure (!) 142/78, pulse 84, temperature 98.2 F (36.8 C), temperature source Oral, resp. rate 18, height 5' 9"  (1.753 m), weight 180 lb (81.6 kg), SpO2 92 %. Physical Exam: Pleasant well-developed well-nourished white male no acute distress Head is normocephalic, atraumatic Lungs are clear to auscultation with equal breath sounds bilaterally.  No wheezing is noted. Heart examination reveals regular rate and rhythm without S3, S4, murmurs The abdomen is soft with a transverse right lower quadrant surgical scar present which is indented.  A large reducible hernia is present.  No rigidity is noted.  CT scan images personally reviewed  Assessment/Plan: Impression: Incisional hernia.  It is concerning that the patient's white blood cell count is elevated.  The does not seem to be evidence of strangulation, though there was some venous congestion on the CT scan.  This is a complex hernia.  I do not think this is a hernia that can be repaired at [facility].  As patient just saw [facility] several days ago, Dr. Roxanne Mins will be contacting them.  Aviva Signs 03/08/2018, 8:47 AM   Addendum:  Both [facility] and [facility] hospitals have  been contacted and are full and cannot accept the patient in transfer.  We will bring the patient into the hospital for observation status due to the leukocytosis.  If he does fine over the next 24 hours, he will be discharged and set up for outpatient visits.

## 2018-03-08 NOTE — ED Notes (Signed)
Dr. Jenkins at bedside. 

## 2018-03-08 NOTE — ED Notes (Signed)
Patient transported to CT 

## 2018-03-09 DIAGNOSIS — K439 Ventral hernia without obstruction or gangrene: Secondary | ICD-10-CM | POA: Diagnosis not present

## 2018-03-09 LAB — CBC
HCT: 45.4 % (ref 39.0–52.0)
HEMOGLOBIN: 14.2 g/dL (ref 13.0–17.0)
MCH: 28.5 pg (ref 26.0–34.0)
MCHC: 31.3 g/dL (ref 30.0–36.0)
MCV: 91.2 fL (ref 78.0–100.0)
Platelets: 266 10*3/uL (ref 150–400)
RBC: 4.98 MIL/uL (ref 4.22–5.81)
RDW: 14.4 % (ref 11.5–15.5)
WBC: 16.1 10*3/uL — ABNORMAL HIGH (ref 4.0–10.5)

## 2018-03-09 LAB — BASIC METABOLIC PANEL
ANION GAP: 10 (ref 5–15)
BUN: 14 mg/dL (ref 6–20)
CHLORIDE: 101 mmol/L (ref 101–111)
CO2: 27 mmol/L (ref 22–32)
Calcium: 8.2 mg/dL — ABNORMAL LOW (ref 8.9–10.3)
Creatinine, Ser: 0.99 mg/dL (ref 0.61–1.24)
GFR calc non Af Amer: >60 mL/min (ref >60)
GLUCOSE: 112 mg/dL — AB (ref 65–99)
Potassium: 3.4 mmol/L — ABNORMAL LOW (ref 3.5–5.1)
Sodium: 138 mmol/L (ref 135–145)

## 2018-03-09 LAB — HIV ANTIBODY (ROUTINE TESTING W REFLEX): HIV Screen 4th Generation wRfx: NONREACTIVE

## 2018-03-09 NOTE — Discharge Summary (Signed)
Physician Discharge Summary  Patient ID: Bruce Mcgee MRN: [id number] DOB/AGE: 60-Mar-1957 60 y.o.  Admit date: 03/08/2018 Discharge date: 03/09/2018  Admission Diagnoses: Incisional hernia  Discharge Diagnoses: Same Active Problems:   Incisional hernia   Discharged Condition: good  Hospital Course: Patient is a 62 year old white male with a history of an incisional hernia who presented to [facility] with a 24-hour history of worsening right-sided abdominal pain and swelling.  Patient had a large incisional hernia that was reducible, but the patient was brought into the hospital due to ongoing pain.  CT scan showed a significant incisional hernia without obstruction.  There was mild venous congestion and a leukocytosis.  The patient had previously seen [facility] last week for this problem.  Patient was brought in under observation status and tolerate a regular diet well.  An abdominal binder was placed.  The patient's pain resolved.  The patient's leukocytosis was improved following day.  The patient is being discharged home on 03/09/2018 in good and improving condition.  He will follow-up with either [facility] or [facility] for repair of his hernia.  Discharge Exam: Blood pressure 129/89, pulse 67, temperature 98.3 F (36.8 C), temperature source Oral, resp. rate 18, height  (1.753 m), weight 178 lb 5.6 oz (80.9 kg), SpO2 92 %. General appearance: alert, cooperative and no distress Resp: clear to auscultation bilaterally Cardio: regular rate and rhythm, S1, S2 normal, no murmur, click, rub or gallop GI: Soft, nontender, nondistended.  Right-sided incisional hernia is reduced.  Disposition: Discharge disposition: 01-Home or Self Care       Discharge Instructions    Diet - low sodium heart healthy   Complete by:  As directed    Increase activity slowly   Complete by:  As directed      Allergies as of 03/09/2018      Reactions   Latex       Medication List    TAKE these medications   albuterol 108 (90 Base) MCG/ACT inhaler Commonly known as:  PROVENTIL HFA;VENTOLIN HFA Inhale 2 puffs into the lungs every 4 (four) hours.   levothyroxine 137 MCG tablet Commonly known as:  SYNTHROID, LEVOTHROID Take 1 tablet by mouth daily.      Follow-up Information    Franky Macho, MD Follow up.   Specialty:  General Surgery Why:  as needed Contact information: [address] [place] [address] [phone]           Signed: Franky Macho 03/09/2018, 7:20 AM

## 2018-03-09 NOTE — Progress Notes (Signed)
Bruce Mcgee discharged Home per MD order.  Discharge instructions reviewed and discussed with the patient, all questions and concerns answered. Copy of instructions given to patient.  Allergies as of 03/09/2018      Reactions   Latex       Medication List    TAKE these medications   albuterol 108 (90 Base) MCG/ACT inhaler Commonly known as:  PROVENTIL HFA;VENTOLIN HFA Inhale 2 puffs into the lungs every 4 (four) hours.   levothyroxine 137 MCG tablet Commonly known as:  SYNTHROID, LEVOTHROID Take 1 tablet by mouth daily.       Patients skin is clean, dry and intact, no evidence of skin break down. IV site discontinued and catheter remains intact. Site without signs and symptoms of complications. Dressing and pressure applied.  Patient escorted to the elevator,  no distress noted upon discharge.  Bruce Mcgee Bruce Mcgee 03/09/2018 9:21 AM

## 2018-03-09 NOTE — Discharge Instructions (Signed)
 Ventral Hernia A ventral hernia is a bulge of tissue from inside the abdomen that pushes through a weak area of the muscles that form the front wall of the abdomen. The tissues inside the abdomen are inside a sac (peritoneum). These tissues include the small intestine, large intestine, and the fatty tissue that covers the intestines (omentum). Sometimes, the bulge that forms a hernia contains intestines. Other hernias contain only fat. Ventral hernias do not go away without surgical treatment. There are several types of ventral hernias. You may have:  A hernia at an incision site from previous abdominal surgery (incisional hernia).  A hernia just above the belly button (epigastric hernia), or at the belly button (umbilical hernia). These types of hernias can develop from heavy lifting or straining.  A hernia that comes and goes (reducible hernia). It may be visible only when you lift or strain. This type of hernia can be pushed back into the abdomen (reduced).  A hernia that traps abdominal tissue inside the hernia (incarcerated hernia). This type of hernia does not reduce.  A hernia that cuts off blood flow to the tissues inside the hernia (strangulated hernia). The tissues can start to die if this happens. This is a very painful bulge that cannot be reduced. A strangulated hernia is a medical emergency.  What are the causes? This condition is caused by abdominal tissue putting pressure on an area of weakness in the abdominal muscles. What increases the risk? The following factors may make you more likely to develop this condition:  Being male.  Being 60 or older.  Being overweight or obese.  Having had previous abdominal surgery, especially if there was an infection after surgery.  Having had an injury to the abdominal wall.  Having had several pregnancies.  Having a buildup of fluid inside the abdomen (ascites).  What are the signs or symptoms? The only symptom of a ventral  hernia may be a painless bulge in the abdomen. A reducible hernia may be visible only when you strain, cough, or lift. Other symptoms may include:  Dull pain.  A feeling of pressure.  Signs and symptoms of a strangulated hernia may include:  Increasing pain.  Nausea and vomiting.  Pain when pressing on the hernia.  The skin over the hernia turning red or purple.  Constipation.  Blood in the stool (feces).  How is this diagnosed? This condition may be diagnosed based on:  Your symptoms.  Your medical history.  A physical exam. You may be asked to cough or strain while standing. These actions increase the pressure inside your abdomen and force the hernia through the opening in your muscles. Your health care provider may try to reduce the hernia by pressing on it.  Imaging studies, such as an ultrasound or CT scan.  How is this treated? This condition is treated with surgery. If you have a strangulated hernia, surgery is done as soon as possible. If your hernia is small and not incarcerated, you may be asked to lose some weight before surgery. Follow these instructions at home:  Follow instructions from your health care provider about eating or drinking restrictions.  If you are overweight, your health care provider may recommend that you increase your activity level and eat a healthier diet.  Do not lift anything that is heavier than 10 lb (4.5 kg).  Return to your normal activities as told by your health care provider. Ask your health care provider what activities are safe for you. You   may need to avoid activities that increase pressure on your hernia.  Take over-the-counter and prescription medicines only as told by your health care provider.  Keep all follow-up visits as told by your health care provider. This is important. Contact a health care provider if:  Your hernia gets larger.  Your hernia becomes painful. Get help right away if:  Your hernia becomes  increasingly painful.  You have pain along with any of the following: ? Changes in skin color in the area of the hernia. ? Nausea. ? Vomiting. ? Fever. Summary  A ventral hernia is a bulge of tissue from inside the abdomen that pushes through a weak area of the muscles that form the front wall of the abdomen.  This condition is treated with surgery, which may be urgent depending on your hernia.  Do not lift anything that is heavier than 10 lb (4.5 kg), and follow activity instructions from your health care provider. This information is not intended to replace advice given to you by your health care provider. Make sure you discuss any questions you have with your health care provider. Document Released: 10/13/2012 Document Revised: 06/13/2016 Document Reviewed: 06/13/2016 Elsevier Interactive Patient Education  2018 [employer] Inc[employer]  

## 2018-03-16 ENCOUNTER — Ambulatory Visit: Payer: Self-pay | Admitting: General Surgery

## 2018-03-16 NOTE — Patient Instructions (Addendum)
Bruce Mcgee  03/16/2018   Your procedure is scheduled on:   Thursday 03/18/2018   Report to [facility] Main  Entrance              Report to admitting at   1100 AM    Call this number if you have problems the morning of surgery [phone]     Remember: Do not eat food  :After Midnight. You may have clear liquids from midnight up until 1000  am!    NO SOLID FOOD AFTER MIDNIGHT THE NIGHT PRIOR TO SURGERY. NOTHING BY MOUTH EXCEPT CLEAR LIQUIDS UNTIL 3 HOURS PRIOR TO SCHEULED SURGERY. PLEASE FINISH ENSURE DRINK PER SURGEON ORDER 3 HOURS PRIOR TO SCHEDULED SURGERY TIME WHICH NEEDS TO BE COMPLETED AT  1000 am.    CLEAR LIQUID DIET   Foods Allowed                                                                     Foods Excluded  Coffee and tea, regular and decaf                             liquids that you cannot  Plain Jell-O in any flavor                                             see through such as: Fruit ices (not with fruit pulp)                                     milk, soups, orange juice  Iced Popsicles                                    All solid food Carbonated beverages, regular and diet                                    Cranberry, grape and apple juices Sports drinks like Gatorade Lightly seasoned clear broth or consume(fat free) Sugar, honey syrup  Sample Menu Breakfast                                Lunch                                     Supper Cranberry juice                    Beef broth                            Chicken broth Jell-O  Grape juice                           Apple juice Coffee or tea                        Jell-O                                      Popsicle                                                Coffee or tea                        Coffee or tea  _____________________________________________________________________    Take these medicines the morning of surgery with A SIP  OF WATER:Levothyroxine (Synthroid), Inhaler as needed and bring it with you to the hospital                                You may not have any metal on your body including hair pins and              piercings  Do not wear jewelry, , lotions, powders or perfumes, deodorant                           Men may shave face and neck.   Do not bring valuables to the hospital. [facility] IS NOT             RESPONSIBLE   FOR VALUABLES.  Contacts, dentures or bridgework may not be worn into surgery.  Leave suitcase in the car. After surgery it may be brought to your room   _____________________________________________________________________            [facility] - Preparing for Surgery Before surgery, you can play an important role.  Because skin is not sterile, your skin needs to be as free of germs as possible.  You can reduce the number of germs on your skin by washing with CHG (chlorahexidine gluconate) soap before surgery.  CHG is an antiseptic cleaner which kills germs and bonds with the skin to continue killing germs even after washing. Please DO NOT use if you have an allergy to CHG or antibacterial soaps.  If your skin becomes reddened/irritated stop using the CHG and inform your nurse when you arrive at Short Stay. Do not shave (including legs and underarms) for at least 48 hours prior to the first CHG shower.  You may shave your face/neck. Please follow these instructions carefully:  1.  Shower with CHG Soap the night before surgery and the  morning of Surgery.  2.  If you choose to wash your hair, wash your hair first as usual with your  normal  shampoo.  3.  After you shampoo, rinse your hair and body thoroughly to remove the  shampoo.                           4.  Use CHG as you would any other liquid soap.  You can apply chg directly  to the skin and wash                       Gently with a scrungie or clean washcloth.  5.  Apply the CHG Soap to your body ONLY FROM THE NECK DOWN.    Do not use on face/ open                           Wound or open sores. Avoid contact with eyes, ears mouth and genitals (private parts).                       Wash face,  Genitals (private parts) with your normal soap.             6.  Wash thoroughly, paying special attention to the area where your surgery  will be performed.  7.  Thoroughly rinse your body with warm water from the neck down.  8.  DO NOT shower/wash with your normal soap after using and rinsing off  the CHG Soap.                9.  Pat yourself dry with a clean towel.            10.  Wear clean pajamas.            11.  Place clean sheets on your bed the night of your first shower and do not  sleep with pets. Day of Surgery : Do not apply any lotions/deodorants the morning of surgery.  Please wear clean clothes to the hospital/surgery center.  FAILURE TO FOLLOW THESE INSTRUCTIONS MAY RESULT IN THE CANCELLATION OF YOUR SURGERY PATIENT SIGNATURE_________________________________  NURSE SIGNATURE__________________________________  ________________________________________________________________________

## 2018-03-17 ENCOUNTER — Encounter (HOSPITAL_COMMUNITY): Payer: Self-pay

## 2018-03-17 ENCOUNTER — Encounter (HOSPITAL_COMMUNITY)
Admission: RE | Admit: 2018-03-17 | Discharge: 2018-03-17 | Disposition: A | Discharge status: Home / Self Care | Payer: 59 | Source: Ambulatory Visit | Attending: General Surgery | Admitting: General Surgery

## 2018-03-17 ENCOUNTER — Other Ambulatory Visit: Payer: Self-pay

## 2018-03-17 HISTORY — DX: Hypothyroidism, unspecified: E03.9

## 2018-03-17 HISTORY — DX: Personal history of urinary calculi: Z87.442

## 2018-03-17 HISTORY — DX: Headache: R51

## 2018-03-17 HISTORY — DX: Headache, unspecified: R51.9

## 2018-03-17 LAB — BASIC METABOLIC PANEL
ANION GAP: 12 (ref 5–15)
BUN: 13 mg/dL (ref 6–20)
CHLORIDE: 105 mmol/L (ref 101–111)
CO2: 24 mmol/L (ref 22–32)
Calcium: 8.6 mg/dL — ABNORMAL LOW (ref 8.9–10.3)
Creatinine, Ser: 0.79 mg/dL (ref 0.61–1.24)
GFR calc Af Amer: >60 mL/min (ref >60)
Glucose, Bld: 98 mg/dL (ref 65–99)
POTASSIUM: 3.8 mmol/L (ref 3.5–5.1)
SODIUM: 141 mmol/L (ref 135–145)

## 2018-03-17 LAB — CBC WITH DIFFERENTIAL/PLATELET
BASOS ABS: 0.1 10*3/uL (ref 0.0–0.1)
Basophils Relative: 1 %
EOS PCT: 2 %
Eosinophils Absolute: 0.3 10*3/uL (ref 0.0–0.7)
HEMATOCRIT: 43.4 % (ref 39.0–52.0)
HEMOGLOBIN: 14.6 g/dL (ref 13.0–17.0)
LYMPHS ABS: 2.7 10*3/uL (ref 0.7–4.0)
LYMPHS PCT: 23 %
MCH: 30.2 pg (ref 26.0–34.0)
MCHC: 33.6 g/dL (ref 30.0–36.0)
MCV: 89.9 fL (ref 78.0–100.0)
Monocytes Absolute: 0.7 10*3/uL (ref 0.1–1.0)
Monocytes Relative: 6 %
NEUTROS ABS: 7.9 10*3/uL — AB (ref 1.7–7.7)
Neutrophils Relative %: 68 %
PLATELETS: 304 10*3/uL (ref 150–400)
RBC: 4.83 MIL/uL (ref 4.22–5.81)
RDW: 13.5 % (ref 11.5–15.5)
WBC: 11.6 10*3/uL — AB (ref 4.0–10.5)

## 2018-03-17 NOTE — Progress Notes (Signed)
03/09/2018- noted in Epic- labs-CBC,BMP

## 2018-03-18 ENCOUNTER — Encounter (HOSPITAL_COMMUNITY): Payer: Self-pay | Admitting: *Deleted

## 2018-03-18 ENCOUNTER — Encounter (HOSPITAL_COMMUNITY)
Admission: RE | Disposition: A | Discharge status: Home / Self Care | Payer: Self-pay | Source: Ambulatory Visit | Attending: General Surgery

## 2018-03-18 ENCOUNTER — Inpatient Hospital Stay (HOSPITAL_COMMUNITY): Payer: 59 | Admitting: Certified Registered Nurse Anesthetist

## 2018-03-18 ENCOUNTER — Inpatient Hospital Stay (HOSPITAL_COMMUNITY)
Admission: RE | Admit: 2018-03-18 | Discharge: 2018-03-20 | DRG: 337 | Disposition: A | Discharge status: Home / Self Care | Payer: 59 | Source: Ambulatory Visit | Attending: General Surgery | Admitting: General Surgery

## 2018-03-18 DIAGNOSIS — K432 Incisional hernia without obstruction or gangrene: Secondary | ICD-10-CM | POA: Diagnosis present

## 2018-03-18 DIAGNOSIS — Z9049 Acquired absence of other specified parts of digestive tract: Secondary | ICD-10-CM

## 2018-03-18 DIAGNOSIS — F17210 Nicotine dependence, cigarettes, uncomplicated: Secondary | ICD-10-CM | POA: Diagnosis present

## 2018-03-18 DIAGNOSIS — Z79890 Hormone replacement therapy (postmenopausal): Secondary | ICD-10-CM | POA: Diagnosis not present

## 2018-03-18 DIAGNOSIS — E039 Hypothyroidism, unspecified: Secondary | ICD-10-CM | POA: Diagnosis present

## 2018-03-18 DIAGNOSIS — Z91048 Other nonmedicinal substance allergy status: Secondary | ICD-10-CM

## 2018-03-18 DIAGNOSIS — Z01812 Encounter for preprocedural laboratory examination: Secondary | ICD-10-CM

## 2018-03-18 HISTORY — PX: INSERTION OF MESH: SHX5868

## 2018-03-18 HISTORY — PX: INCISIONAL HERNIA REPAIR: SHX193

## 2018-03-18 LAB — CBC
HEMATOCRIT: 43.5 % (ref 39.0–52.0)
Hemoglobin: 14.1 g/dL (ref 13.0–17.0)
MCH: 29.2 pg (ref 26.0–34.0)
MCHC: 32.4 g/dL (ref 30.0–36.0)
MCV: 90.1 fL (ref 78.0–100.0)
Platelets: 317 10*3/uL (ref 150–400)
RBC: 4.83 MIL/uL (ref 4.22–5.81)
RDW: 13.5 % (ref 11.5–15.5)
WBC: 21.3 10*3/uL — AB (ref 4.0–10.5)

## 2018-03-18 LAB — CREATININE, SERUM
Creatinine, Ser: 0.98 mg/dL (ref 0.61–1.24)
GFR calc Af Amer: >60 mL/min (ref >60)
GFR calc non Af Amer: >60 mL/min (ref >60)

## 2018-03-18 SURGERY — REPAIR, HERNIA, INCISIONAL
Anesthesia: General | Site: Abdomen

## 2018-03-18 MED ORDER — PHENYLEPHRINE HCL 10 MG/ML IJ SOLN
INTRAMUSCULAR | Status: DC | PRN
  Administered 2018-03-18: 40 ug via INTRAVENOUS
  Administered 2018-03-18: 80 ug via INTRAVENOUS

## 2018-03-18 MED ORDER — HEPARIN SODIUM (PORCINE) 5000 UNIT/ML IJ SOLN
5000.0000 [IU] | Freq: Once | INTRAMUSCULAR | Status: AC
  Administered 2018-03-18: 5000 [IU] via SUBCUTANEOUS
  Filled 2018-03-18: qty 1

## 2018-03-18 MED ORDER — ONDANSETRON HCL 4 MG/2ML IJ SOLN
INTRAMUSCULAR | Status: DC | PRN
  Administered 2018-03-18: 4 mg via INTRAVENOUS

## 2018-03-18 MED ORDER — ACETAMINOPHEN 500 MG PO TABS: 1000.0000 mg | ORAL_TABLET | Freq: Four times a day (QID) | ORAL | Status: DC

## 2018-03-18 MED ORDER — KETAMINE HCL 10 MG/ML IJ SOLN
INTRAMUSCULAR | Status: AC
  Filled 2018-03-18: qty 1

## 2018-03-18 MED ORDER — HYDROCODONE-ACETAMINOPHEN 5-325 MG PO TABS: 1.0000 | ORAL_TABLET | ORAL | Status: DC | PRN

## 2018-03-18 MED ORDER — DIPHENHYDRAMINE HCL 50 MG/ML IJ SOLN: 25.0000 mg | Freq: Four times a day (QID) | INTRAMUSCULAR | Status: DC | PRN

## 2018-03-18 MED ORDER — 0.9 % SODIUM CHLORIDE (POUR BTL) OPTIME
TOPICAL | Status: DC | PRN
  Administered 2018-03-18: 1000 mL

## 2018-03-18 MED ORDER — ROCURONIUM BROMIDE 100 MG/10ML IV SOLN
INTRAVENOUS | Status: DC | PRN
  Administered 2018-03-18: 10 mg via INTRAVENOUS
  Administered 2018-03-18: 50 mg via INTRAVENOUS
  Administered 2018-03-18: 10 mg via INTRAVENOUS

## 2018-03-18 MED ORDER — HYDRALAZINE HCL 20 MG/ML IJ SOLN: 10.0000 mg | INTRAMUSCULAR | Status: DC | PRN

## 2018-03-18 MED ORDER — LIDOCAINE 20MG/ML (2%) 15 ML SYRINGE OPTIME: INTRAMUSCULAR | Status: DC | PRN

## 2018-03-18 MED ORDER — OXYCODONE HCL 5 MG/5ML PO SOLN
5.0000 mg | Freq: Once | ORAL | Status: DC | PRN
  Filled 2018-03-18: qty 5

## 2018-03-18 MED ORDER — OXYCODONE HCL 5 MG PO TABS: 5.0000 mg | ORAL_TABLET | Freq: Once | ORAL | Status: DC | PRN

## 2018-03-18 MED ORDER — SUGAMMADEX SODIUM 200 MG/2ML IV SOLN
INTRAVENOUS | Status: AC
  Filled 2018-03-18: qty 2

## 2018-03-18 MED ORDER — MIDAZOLAM HCL 2 MG/2ML IJ SOLN
INTRAMUSCULAR | Status: AC
  Filled 2018-03-18: qty 2

## 2018-03-18 MED ORDER — POLYETHYLENE GLYCOL 3350 17 G PO PACK
17.0000 g | PACK | Freq: Every day | ORAL | Status: DC | PRN
  Administered 2018-03-19: 17 g via ORAL
  Filled 2018-03-18: qty 1

## 2018-03-18 MED ORDER — DIPHENHYDRAMINE HCL 25 MG PO CAPS: 25.0000 mg | ORAL_CAPSULE | Freq: Four times a day (QID) | ORAL | Status: DC | PRN

## 2018-03-18 MED ORDER — LIDOCAINE 2% (20 MG/ML) 5 ML SYRINGE
INTRAMUSCULAR | Status: AC
  Filled 2018-03-18: qty 5

## 2018-03-18 MED ORDER — BUPIVACAINE-EPINEPHRINE (PF) 0.5% -1:200000 IJ SOLN
INTRAMUSCULAR | Status: DC | PRN
  Administered 2018-03-18: 30 mL

## 2018-03-18 MED ORDER — MORPHINE SULFATE (PF) 2 MG/ML IV SOLN: 2.0000 mg | INTRAVENOUS | Status: DC | PRN

## 2018-03-18 MED ORDER — KETAMINE HCL 10 MG/ML IJ SOLN
INTRAMUSCULAR | Status: DC | PRN
  Administered 2018-03-18: 40 mg via INTRAVENOUS

## 2018-03-18 MED ORDER — FENTANYL CITRATE (PF) 250 MCG/5ML IJ SOLN
INTRAMUSCULAR | Status: AC
  Filled 2018-03-18: qty 5

## 2018-03-18 MED ORDER — ALBUTEROL SULFATE (2.5 MG/3ML) 0.083% IN NEBU: 2.5000 mg | INHALATION_SOLUTION | RESPIRATORY_TRACT | Status: DC | PRN

## 2018-03-18 MED ORDER — GABAPENTIN 300 MG PO CAPS
300.0000 mg | ORAL_CAPSULE | ORAL | Status: AC
  Administered 2018-03-18: 300 mg via ORAL
  Filled 2018-03-18: qty 1

## 2018-03-18 MED ORDER — FENTANYL CITRATE (PF) 100 MCG/2ML IJ SOLN
INTRAMUSCULAR | Status: DC | PRN
  Administered 2018-03-18: 100 ug via INTRAVENOUS
  Administered 2018-03-18: 50 ug via INTRAVENOUS

## 2018-03-18 MED ORDER — LEVOTHYROXINE SODIUM 25 MCG PO TABS
137.0000 ug | ORAL_TABLET | Freq: Every day | ORAL | Status: DC
  Administered 2018-03-19 – 2018-03-20 (×2): 137 ug via ORAL
  Filled 2018-03-18 (×2): qty 1

## 2018-03-18 MED ORDER — METHOCARBAMOL 500 MG PO TABS: 500.0000 mg | ORAL_TABLET | Freq: Four times a day (QID) | ORAL | Status: DC | PRN

## 2018-03-18 MED ORDER — FENTANYL CITRATE (PF) 100 MCG/2ML IJ SOLN
100.0000 ug | INTRAMUSCULAR | Status: DC | PRN
  Filled 2018-03-18: qty 2

## 2018-03-18 MED ORDER — CHLORHEXIDINE GLUCONATE 4 % EX LIQD: 60.0000 mL | Freq: Once | CUTANEOUS | Status: DC

## 2018-03-18 MED ORDER — ONDANSETRON 4 MG PO TBDP: 4.0000 mg | ORAL_TABLET | Freq: Four times a day (QID) | ORAL | Status: DC | PRN

## 2018-03-18 MED ORDER — MIDAZOLAM HCL 2 MG/2ML IJ SOLN
2.0000 mg | INTRAMUSCULAR | Status: DC | PRN
  Filled 2018-03-18: qty 2

## 2018-03-18 MED ORDER — GABAPENTIN 300 MG PO CAPS
300.0000 mg | ORAL_CAPSULE | Freq: Two times a day (BID) | ORAL | Status: DC
  Administered 2018-03-18 – 2018-03-20 (×4): 300 mg via ORAL
  Filled 2018-03-18 (×4): qty 1

## 2018-03-18 MED ORDER — ACETAMINOPHEN 500 MG PO TABS
1000.0000 mg | ORAL_TABLET | ORAL | Status: AC
  Administered 2018-03-18: 1000 mg via ORAL
  Filled 2018-03-18: qty 2

## 2018-03-18 MED ORDER — BUPIVACAINE-EPINEPHRINE (PF) 0.5% -1:200000 IJ SOLN
INTRAMUSCULAR | Status: AC
  Filled 2018-03-18: qty 30

## 2018-03-18 MED ORDER — CELECOXIB 200 MG PO CAPS
200.0000 mg | ORAL_CAPSULE | Freq: Two times a day (BID) | ORAL | Status: DC
  Administered 2018-03-18 – 2018-03-20 (×4): 200 mg via ORAL
  Filled 2018-03-18 (×4): qty 1

## 2018-03-18 MED ORDER — CHLORHEXIDINE GLUCONATE 4 % EX LIQD
60.0000 mL | Freq: Once | CUTANEOUS | Status: AC
  Administered 2018-03-18: 4 via TOPICAL

## 2018-03-18 MED ORDER — SODIUM CHLORIDE 0.9 % IV SOLN
INTRAVENOUS | Status: DC
  Administered 2018-03-18: 1000 mL via INTRAVENOUS
  Administered 2018-03-19: 18:00:00 via INTRAVENOUS

## 2018-03-18 MED ORDER — ROCURONIUM BROMIDE 10 MG/ML (PF) SYRINGE
PREFILLED_SYRINGE | INTRAVENOUS | Status: AC
  Filled 2018-03-18: qty 5

## 2018-03-18 MED ORDER — LACTATED RINGERS IV SOLN
INTRAVENOUS | Status: DC
  Administered 2018-03-18 (×2): via INTRAVENOUS

## 2018-03-18 MED ORDER — TRAMADOL HCL 50 MG PO TABS: 50.0000 mg | ORAL_TABLET | Freq: Four times a day (QID) | ORAL | Status: DC | PRN

## 2018-03-18 MED ORDER — LIDOCAINE 20MG/ML (2%) 15 ML SYRINGE OPTIME
INTRAMUSCULAR | Status: DC | PRN
  Administered 2018-03-18: 1.5 mg/kg/h via INTRAVENOUS

## 2018-03-18 MED ORDER — DEXAMETHASONE SODIUM PHOSPHATE 10 MG/ML IJ SOLN
INTRAMUSCULAR | Status: AC
  Filled 2018-03-18: qty 1

## 2018-03-18 MED ORDER — CELECOXIB 200 MG PO CAPS
400.0000 mg | ORAL_CAPSULE | ORAL | Status: AC
  Administered 2018-03-18: 400 mg via ORAL
  Filled 2018-03-18: qty 2

## 2018-03-18 MED ORDER — HYDROCODONE-ACETAMINOPHEN 5-325 MG PO TABS
1.0000 | ORAL_TABLET | ORAL | Status: DC | PRN
  Administered 2018-03-19: 1 via ORAL
  Filled 2018-03-18: qty 1

## 2018-03-18 MED ORDER — SUGAMMADEX SODIUM 200 MG/2ML IV SOLN
INTRAVENOUS | Status: DC | PRN
  Administered 2018-03-18: 200 mg via INTRAVENOUS

## 2018-03-18 MED ORDER — MIDAZOLAM HCL 5 MG/5ML IJ SOLN
INTRAMUSCULAR | Status: DC | PRN
  Administered 2018-03-18: 2 mg via INTRAVENOUS

## 2018-03-18 MED ORDER — PROPOFOL 10 MG/ML IV BOLUS
INTRAVENOUS | Status: DC | PRN
  Administered 2018-03-18: 150 mg via INTRAVENOUS

## 2018-03-18 MED ORDER — ENOXAPARIN SODIUM 40 MG/0.4ML ~~LOC~~ SOLN
40.0000 mg | SUBCUTANEOUS | Status: DC
  Administered 2018-03-19 – 2018-03-20 (×2): 40 mg via SUBCUTANEOUS
  Filled 2018-03-18 (×2): qty 0.4

## 2018-03-18 MED ORDER — ONDANSETRON HCL 4 MG/2ML IJ SOLN: 4.0000 mg | Freq: Four times a day (QID) | INTRAMUSCULAR | Status: DC | PRN

## 2018-03-18 MED ORDER — ENSURE PRE-SURGERY PO LIQD
296.0000 mL | Freq: Once | ORAL | Status: AC
  Administered 2018-03-18: 296 mL via ORAL
  Filled 2018-03-18: qty 296

## 2018-03-18 MED ORDER — DEXAMETHASONE SODIUM PHOSPHATE 10 MG/ML IJ SOLN
INTRAMUSCULAR | Status: DC | PRN
  Administered 2018-03-18: 10 mg via INTRAVENOUS

## 2018-03-18 MED ORDER — CEFAZOLIN SODIUM-DEXTROSE 2-4 GM/100ML-% IV SOLN
2.0000 g | INTRAVENOUS | Status: AC
  Administered 2018-03-18: 2 g via INTRAVENOUS
  Filled 2018-03-18: qty 100

## 2018-03-18 MED ORDER — BUPIVACAINE LIPOSOME 1.3 % IJ SUSP
20.0000 mL | Freq: Once | INTRAMUSCULAR | Status: AC
  Administered 2018-03-18: 20 mL
  Filled 2018-03-18: qty 20

## 2018-03-18 MED ORDER — ONDANSETRON HCL 4 MG/2ML IJ SOLN
INTRAMUSCULAR | Status: AC
  Filled 2018-03-18: qty 2

## 2018-03-18 MED ORDER — PROPOFOL 10 MG/ML IV BOLUS
INTRAVENOUS | Status: AC
  Filled 2018-03-18: qty 20

## 2018-03-18 MED ORDER — FENTANYL CITRATE (PF) 100 MCG/2ML IJ SOLN: 25.0000 ug | INTRAMUSCULAR | Status: DC | PRN

## 2018-03-18 MED ORDER — ACETAMINOPHEN 325 MG PO TABS: 650.0000 mg | ORAL_TABLET | Freq: Four times a day (QID) | ORAL | Status: DC | PRN

## 2018-03-18 SURGICAL SUPPLY — 39 items
BINDER ABDOMINAL 12 ML 46-62 (SOFTGOODS) ×3 IMPLANT
CHLORAPREP W/TINT 26ML (MISCELLANEOUS) ×3 IMPLANT
COVER SURGICAL LIGHT HANDLE (MISCELLANEOUS) ×3 IMPLANT
DECANTER SPIKE VIAL GLASS SM (MISCELLANEOUS) ×3 IMPLANT
DERMABOND ADVANCED (GAUZE/BANDAGES/DRESSINGS)
DERMABOND ADVANCED .7 DNX12 (GAUZE/BANDAGES/DRESSINGS) IMPLANT
DRAIN CHANNEL 19F RND (DRAIN) ×3 IMPLANT
DRAPE LAPAROSCOPIC ABDOMINAL (DRAPES) ×3 IMPLANT
DRSG TELFA 4X10 ISLAND STR (GAUZE/BANDAGES/DRESSINGS) ×3 IMPLANT
ELECT PENCIL ROCKER SW 15FT (MISCELLANEOUS) ×3 IMPLANT
ELECT REM PT RETURN 15FT ADLT (MISCELLANEOUS) ×3 IMPLANT
EVACUATOR SILICONE 100CC (DRAIN) ×3 IMPLANT
GLOVE BIOGEL PI IND STRL 7.0 (GLOVE) ×3 IMPLANT
GLOVE BIOGEL PI INDICATOR 7.0 (GLOVE) ×6
GLOVE SURG SS PI 7.0 STRL IVOR (GLOVE) ×9 IMPLANT
GOWN STRL REUS W/TWL LRG LVL3 (GOWN DISPOSABLE) ×6 IMPLANT
GOWN STRL REUS W/TWL XL LVL3 (GOWN DISPOSABLE) ×3 IMPLANT
KIT BASIN OR (CUSTOM PROCEDURE TRAY) ×3 IMPLANT
MESH HERNIA 6X6 BARD (Mesh General) ×1 IMPLANT
MESH HERNIA BARD 6X6 (Mesh General) ×2 IMPLANT
NEEDLE HYPO 22GX1.5 SAFETY (NEEDLE) ×3 IMPLANT
PACK BASIC VI WITH GOWN DISP (CUSTOM PROCEDURE TRAY) ×3 IMPLANT
RETAINER VISCERA MED (MISCELLANEOUS) ×3 IMPLANT
SPONGE LAP 4X18 RFD (DISPOSABLE) ×3 IMPLANT
SUT ETHILON 2 0 PS N (SUTURE) ×3 IMPLANT
SUT MNCRL AB 4-0 PS2 18 (SUTURE) ×3 IMPLANT
SUT NOVA NAB GS-21 0 18 T12 DT (SUTURE) ×9 IMPLANT
SUT PDS AB 0 CT1 36 (SUTURE) ×21 IMPLANT
SUT PDS AB 0 CTX 36 PDP370T (SUTURE) ×3 IMPLANT
SUT SILK 2 0 (SUTURE) ×3
SUT SILK 2-0 30XBRD TIE 12 (SUTURE) ×1 IMPLANT
SUT VIC AB 3-0 SH 27 (SUTURE) ×3
SUT VIC AB 3-0 SH 27X BRD (SUTURE) ×1 IMPLANT
SYR CONTROL 10ML LL (SYRINGE) ×3 IMPLANT
TOWEL OR 17X26 10 PK STRL BLUE (TOWEL DISPOSABLE) ×3 IMPLANT
TOWEL OR NON WOVEN STRL DISP B (DISPOSABLE) ×3 IMPLANT
TRAY FOLEY W/BAG SLVR 16FR (SET/KITS/TRAYS/PACK) ×3
TRAY FOLEY W/BAG SLVR 16FR ST (SET/KITS/TRAYS/PACK) ×1 IMPLANT
YANKAUER SUCT BULB TIP 10FT TU (MISCELLANEOUS) IMPLANT

## 2018-03-18 NOTE — Anesthesia Preprocedure Evaluation (Addendum)
Anesthesia Evaluation  Patient identified by MRN, date of birth, ID band Patient awake    Reviewed: Allergy & Precautions, H&P , NPO status   Airway Mallampati: II   Neck ROM: full    Dental   Pulmonary Current Smoker,    breath sounds clear to auscultation       Cardiovascular negative cardio ROS   Rhythm:regular Rate:Normal     Neuro/Psych  Headaches,    GI/Hepatic   Endo/Other  Hypothyroidism   Renal/GU stones     Musculoskeletal   Abdominal   Peds  Hematology   Anesthesia Other Findings   Reproductive/Obstetrics                             Anesthesia Physical Anesthesia Plan  ASA: II  Anesthesia Plan: General   Post-op Pain Management:    Induction: Intravenous  PONV Risk Score and Plan: 1 and Ondansetron, Dexamethasone, Midazolam and Treatment may vary due to age or medical condition  Airway Management Planned: Oral ETT  Additional Equipment:   Intra-op Plan:   Post-operative Plan: Extubation in OR  Informed Consent: I have reviewed the patients History and Physical, chart, labs and discussed the procedure including the risks, benefits and alternatives for the proposed anesthesia with the patient or authorized representative who has indicated his/her understanding and acceptance.     Plan Discussed with: CRNA, Anesthesiologist and Surgeon  Anesthesia Plan Comments:        Anesthesia Quick Evaluation

## 2018-03-18 NOTE — Op Note (Signed)
Preoperative diagnosis: incisional hernia  Postoperative diagnosis: same   Procedure:  1. Incisional hernia repair with mesh placed in the retrorectus/transversus abdominus space 2. Transversus abdominus release with myofascial advancement on the right side 3. Lysis of adhesions   Surgeon: Feliciana Rossetti, M.D.  Anesthesia: general  Indications for procedure: Bruce Mcgee is a 62 y.o. year old male with symptoms of abdominal pain, swelling, constipation and findings consistent with incisional hernia from previous open appendectomy.  Description of procedure: The patient was brought into the operative suite. Anesthesia was administered with General endotracheal anesthesia. WHO checklist was applied. The patient was then placed in supine position. The area was prepped and draped in the usual sterile fashion.  Next, a vertical elliptical incision was made over the hernia.  Cautery was used to dissect down through subtenons tissues hernia sac was identified and entered.  The sac was freed from the surrounding tissue.  Once in the sac, contents of the hernia were the right colon and omentum.  Using cautery as well as clamp and tie the adhesions were freed from the hernia wall.  This took approximately 30 minutes.  Visceral contents were then reduced back into the abdominal domain.  The majority of the hernia sac was removed with cautery.  Fascial defect was identified as being approximately 7 cm in diameter.  The anterior fascia was freed from previous subcutaneous scar with cautery.  Exparel Marcaine mix was injected into the muscle on all sides of the fascial defect.    Due to location and size, the decision was made to release the superior rectus sheath as well as the transversus abdominis.  Therefore, an incision was made on the lateral aspect of the retrorectus anterior fascia and the space was identified and created in the retrorectus space to the midline.  The space was expanded superiorly  and inferiorly than 3 cm beyond fascial defect.  Next, incision was made in the fascia of the external oblique and the plane between the external oblique and transversus was identified in this space was expanded out laterally to the anterior superior iliac spine and superiorly nearly to the ribs.  The area was irrigated with warm saline and hemostasis was applied with cautery.  The posterior rectus sheath was then treated to the transversus abdominis fascia using 0 PDS in interrupted fashion.  Next, a 15 x 15 cm Bard single layered mesh was sutured to the posterior rectus sheath as well as transversus abdominis fascia using 0 Novafil in interrupted fashion.  Next, the anterior rectus sheath was sutured to the anterior fascia of the oblique muscles using 0 PDS in interrupted fashion.  Prior to complete closure of this layer, a 42 [place] Blake drain was placed over top of the mesh brought through the fascial closure such that a portion of the drainage portion of the drain was in the subcutaneous space.  The drain was then brought out through a small incision in the right lower abdomen.  The drain was sutured in place with a 2-0 nylon.  Subcutaneous deep dermal tissue was closed with a running 3-0 Vicryl.  Skin was closed with 4-0 Monocryl in subcuticular fashion.  Dry dressing was put in place for dressing.  Abdominal binder was applied.  Patient awoke from anesthesia and brought to PACU stable condition.  All counts were correct.  Findings: 7 cm fascial defect in the right abdominal area with hernia sac containing right colon and omentum multiple chronic scars of omentum and colon to the  hernia sac.  Specimen: None  Implant: 15 x 15 cm Bard mesh, 19 French Blake drain with tip lying directly over the mesh and portion of the drain lying in the subcutaneous tissue.  Blood loss: 100 mL  Local anesthesia: 50 ml Exparel:Marcaine Mix  Complications: None  Feliciana Rossetti, M.D. General, Bariatric, &  Minimally Invasive Surgery [facility]

## 2018-03-18 NOTE — H&P (Signed)
Bruce Mcgee is an 62 y.o. male.   Chief Complaint: incisional hernia HPI: 62 yo male with enlarging incisional hernia since appendectomy several years ago. It causes occasional tenderness and last week got very firm and painful requiring reduction in the ER.  Past Medical History:  Diagnosis Date  . Headache   . History of kidney stones   . Hypothyroidism     Past Surgical History:  Procedure Laterality Date  . APPENDECTOMY    . LIPOMA EXCISION     on left shoulder    History reviewed. No pertinent family history. Social History:  reports that he has been smoking cigarettes.  He has been smoking about 2.00 packs per day. He has never used smokeless tobacco. He reports that he does not drink alcohol or use drugs.  Allergies:  Allergies  Allergen Reactions  . Adhesive [Tape]     Pulls off skin     Medications Prior to Admission  Medication Sig Dispense Refill  . albuterol (PROVENTIL HFA;VENTOLIN HFA) 108 (90 Base) MCG/ACT inhaler Inhale 2 puffs into the lungs every 4 (four) hours as needed for wheezing or shortness of breath.     . Aspirin-Acetaminophen-Caffeine (GOODY HEADACHE PO) Take 1 packet by mouth daily as needed (headaches).    Marland Kitchen levothyroxine (SYNTHROID, LEVOTHROID) 137 MCG tablet Take 137 mcg by mouth daily.       Results for orders placed or performed during the hospital encounter of 03/17/18 (from the past 48 hour(s))  Basic metabolic panel     Status: Abnormal   Collection Time: 03/17/18  9:50 AM  Result Value Ref Range   Sodium 141 135 - 145 mmol/L   Potassium 3.8 3.5 - 5.1 mmol/L   Chloride 105 101 - 111 mmol/L   CO2 24 22 - 32 mmol/L   Glucose, Bld 98 65 - 99 mg/dL   BUN 13 6 - 20 mg/dL   Creatinine, Ser 0.79 0.61 - 1.24 mg/dL   Calcium 8.6 (L) 8.9 - 10.3 mg/dL   GFR calc non Af Amer >60 >60 mL/min   GFR calc Af Amer >60 >60 mL/min    Comment: (NOTE) The eGFR has been calculated using the CKD EPI equation. This calculation has not been validated in  all clinical situations. eGFR's persistently <60 mL/min signify possible Chronic Kidney Disease.    Anion gap 12 5 - 15    Comment: Performed at [facility], [place] [address], [place], [place] [address]  CBC WITH DIFFERENTIAL     Status: Abnormal   Collection Time: 03/17/18  9:50 AM  Result Value Ref Range   WBC 11.6 (H) 4.0 - 10.5 K/uL   RBC 4.83 4.22 - 5.81 MIL/uL   Hemoglobin 14.6 13.0 - 17.0 g/dL   HCT 43.4 39.0 - 52.0 %   MCV 89.9 78.0 - 100.0 fL   MCH 30.2 26.0 - 34.0 pg   MCHC 33.6 30.0 - 36.0 g/dL   RDW 13.5 11.5 - 15.5 %   Platelets 304 150 - 400 K/uL   Neutrophils Relative % 68 %   Neutro Abs 7.9 (H) 1.7 - 7.7 K/uL   Lymphocytes Relative 23 %   Lymphs Abs 2.7 0.7 - 4.0 K/uL   Monocytes Relative 6 %   Monocytes Absolute 0.7 0.1 - 1.0 K/uL   Eosinophils Relative 2 %   Eosinophils Absolute 0.3 0.0 - 0.7 K/uL   Basophils Relative 1 %   Basophils Absolute 0.1 0.0 - 0.1 K/uL  Comment: Performed at [facility], [place] [address], [place], [place] [address]   No results found.  Review of Systems  Constitutional: Negative for chills and fever.  HENT: Negative for hearing loss.   Eyes: Negative for blurred vision and double vision.  Respiratory: Negative for cough and hemoptysis.   Cardiovascular: Negative for chest pain and palpitations.  Gastrointestinal: Positive for abdominal pain. Negative for nausea and vomiting.  Genitourinary: Negative for dysuria and urgency.  Musculoskeletal: Negative for myalgias and neck pain.  Skin: Negative for itching and rash.  Neurological: Negative for dizziness, tingling and headaches.  Endo/Heme/Allergies: Does not bruise/bleed easily.  Psychiatric/Behavioral: Negative for depression and suicidal ideas.    Blood pressure 124/85, pulse 84, temperature 98 F (36.7 C), temperature source Oral, resp. rate 20, height 5' 9"  (1.753 m), weight 82.1 kg (181 lb), SpO2 95 %. Physical Exam  Vitals  reviewed. Constitutional: He is oriented to person, place, and time. He appears well-developed and well-nourished.  HENT:  Head: Normocephalic and atraumatic.  Eyes: Pupils are equal, round, and reactive to light. Conjunctivae and EOM are normal.  Neck: Normal range of motion. Neck supple.  Cardiovascular: Normal rate and regular rhythm.  Respiratory: Effort normal and breath sounds normal.  GI: Soft. Bowel sounds are normal. He exhibits no distension. There is no tenderness.  Right incisional hernia unable to fully reduce.  Musculoskeletal: Normal range of motion.  Neurological: He is alert and oriented to person, place, and time.  Skin: Skin is warm and dry.  Psychiatric: He has a normal mood and affect. His behavior is normal.     Assessment/Plan 62 yo male with incisional hernia with defect at the Mcburney's area, 7cm defect. -open incisional hernia -admit to floor postop  Mickeal Skinner, MD 03/18/2018, 12:50 PM

## 2018-03-18 NOTE — Anesthesia Procedure Notes (Signed)
Procedure Name: Intubation Date/Time: 03/18/2018 1:13 PM Performed by: Thornell Mule, CRNA Pre-anesthesia Checklist: Patient identified, Emergency Drugs available, Suction available and Patient being monitored Patient Re-evaluated:Patient Re-evaluated prior to induction Oxygen Delivery Method: Circle system utilized Preoxygenation: Pre-oxygenation with 100% oxygen Induction Type: IV induction Ventilation: Mask ventilation without difficulty Laryngoscope Size: Miller and 3 Grade View: Grade I Tube type: Oral Tube size: 7.5 mm Number of attempts: 1 Airway Equipment and Method: Stylet and Oral airway Placement Confirmation: ETT inserted through vocal cords under direct vision,  positive ETCO2 and breath sounds checked- equal and bilateral Secured at: 21 cm Tube secured with: Tape Dental Injury: Teeth and Oropharynx as per pre-operative assessment

## 2018-03-18 NOTE — Transfer of Care (Signed)
Immediate Anesthesia Transfer of Care Note  Patient: Bruce Mcgee  Procedure(s) Performed: INCISIONAL HERNIA REPAIR (N/A Abdomen) INSERTION OF MESH RECTRARECTAL TRANSVERSUS ABDOMINAL RELEASE (N/A Abdomen)  Patient Location: PACU  Anesthesia Type:General  Level of Consciousness: sedated and drowsy  Airway & Oxygen Therapy: Patient Spontanous Breathing and Patient connected to face mask oxygen  Post-op Assessment: Report given to RN and Post -op Vital signs reviewed and stable  Post vital signs: Reviewed and stable  Last Vitals:  Vitals Value Taken Time  BP 126/82 03/18/2018  3:31 PM  Temp 36.9 C 03/18/2018  3:31 PM  Pulse 86 03/18/2018  3:32 PM  Resp 30 03/18/2018  3:32 PM  SpO2 100 % 03/18/2018  3:32 PM  Vitals shown include unvalidated device data.  Last Pain:  Vitals:   03/18/18 1141  TempSrc:   PainSc: 0-No pain         Complications: No apparent anesthesia complications

## 2018-03-19 ENCOUNTER — Encounter (HOSPITAL_COMMUNITY): Payer: Self-pay | Admitting: General Surgery

## 2018-03-19 ENCOUNTER — Other Ambulatory Visit: Payer: Self-pay

## 2018-03-19 LAB — BASIC METABOLIC PANEL
ANION GAP: 11 (ref 5–15)
BUN: 14 mg/dL (ref 6–20)
CHLORIDE: 104 mmol/L (ref 101–111)
CO2: 26 mmol/L (ref 22–32)
Calcium: 8.6 mg/dL — ABNORMAL LOW (ref 8.9–10.3)
Creatinine, Ser: 0.91 mg/dL (ref 0.61–1.24)
GFR calc non Af Amer: >60 mL/min (ref >60)
GLUCOSE: 125 mg/dL — AB (ref 65–99)
Potassium: 4.9 mmol/L (ref 3.5–5.1)
Sodium: 141 mmol/L (ref 135–145)

## 2018-03-19 LAB — CBC WITH DIFFERENTIAL/PLATELET
Basophils Absolute: 0 10*3/uL (ref 0.0–0.1)
Basophils Relative: 0 %
EOS PCT: 0 %
Eosinophils Absolute: 0 10*3/uL (ref 0.0–0.7)
HCT: 43 % (ref 39.0–52.0)
Hemoglobin: 13.9 g/dL (ref 13.0–17.0)
LYMPHS ABS: 2.7 10*3/uL (ref 0.7–4.0)
LYMPHS PCT: 11 %
MCH: 29.2 pg (ref 26.0–34.0)
MCHC: 32.3 g/dL (ref 30.0–36.0)
MCV: 90.3 fL (ref 78.0–100.0)
MONO ABS: 1.9 10*3/uL — AB (ref 0.1–1.0)
Monocytes Relative: 8 %
Neutro Abs: 19.6 10*3/uL — ABNORMAL HIGH (ref 1.7–7.7)
Neutrophils Relative %: 81 %
PLATELETS: 350 10*3/uL (ref 150–400)
RBC: 4.76 MIL/uL (ref 4.22–5.81)
RDW: 13.4 % (ref 11.5–15.5)
WBC: 24.2 10*3/uL — ABNORMAL HIGH (ref 4.0–10.5)

## 2018-03-19 LAB — CBC
HEMATOCRIT: 43.2 % (ref 39.0–52.0)
HEMOGLOBIN: 14 g/dL (ref 13.0–17.0)
MCH: 29.1 pg (ref 26.0–34.0)
MCHC: 32.4 g/dL (ref 30.0–36.0)
MCV: 89.8 fL (ref 78.0–100.0)
Platelets: 336 10*3/uL (ref 150–400)
RBC: 4.81 MIL/uL (ref 4.22–5.81)
RDW: 13.4 % (ref 11.5–15.5)
WBC: 21.4 10*3/uL — ABNORMAL HIGH (ref 4.0–10.5)

## 2018-03-19 MED ORDER — IBUPROFEN 800 MG PO TABS: 800.0000 mg | ORAL_TABLET | Freq: Three times a day (TID) | ORAL | 0 refills | Status: DC | PRN

## 2018-03-19 MED ORDER — HYDROCODONE-ACETAMINOPHEN 5-325 MG PO TABS: 1.0000 | ORAL_TABLET | Freq: Four times a day (QID) | ORAL | 0 refills | Status: DC | PRN

## 2018-03-19 NOTE — Progress Notes (Signed)
  Progress Note: General Surgery Service   Assessment/Plan: Patient Active Problem List   Diagnosis Date Noted  . Incisional hernia 03/08/2018  . Abdominal wall hernia    s/p Procedure(s): INCISIONAL HERNIA REPAIR,  RECTRARECTAL TRANSVERSUS ABDOMINAL RELEASE INSERTION OF MESH 03/18/2018 Patient looks very good, some bloody drainage from drain, leukocytosis higher than expected -continue ambulation -continue reg diet -recheck CBC in am -keep eye on blake drainage    LOS: 1 day  Chief Complaint/Subjective: Minimal pain, tolerating diet, +flatus  Objective: Vital signs in last 24 hours: Temp:  [97.5 F (36.4 C)-98.9 F (37.2 C)] 97.5 F (36.4 C) (05/10 1313) Pulse Rate:  [71-94] 84 (05/10 1313) Resp:  [16-30] 17 (05/10 1313) BP: (121-148)/(76-96) 130/76 (05/10 1313) SpO2:  [94 %-100 %] 97 % (05/10 1313) Last BM Date: 03/18/18  Intake/Output from previous day: 05/09 0701 - 05/10 0700 In: 2526.7 [P.O.:360; I.V.:2066.7; IV Piggyback:100] Out: 2130 [Urine:1975; Drains:55; Blood:100] Intake/Output this shift: Total I/O In: -  Out: 670 [Urine:600; Drains:70]  Gen: NAD  Lungs: CTAB  Cardiovascular: RRR  Abd: soft, min tenderness, dressing with small amount seep through, drain with moderate sanguinous drainage  Extremities: no edema  Neuro: AOx4  Studies/Results:  WBC 24 up from 21 this morning and 11 preop   Rodman Pickle, MD [facility]

## 2018-03-19 NOTE — Progress Notes (Signed)
Patient reports taking oral prednisone last week for; prescribed by pcp for sinusitis.  May be affecting WBC. Patient states he forgot to tell staff he was on it prior to surgery.

## 2018-03-20 LAB — CBC WITH DIFFERENTIAL/PLATELET
BASOS ABS: 0 10*3/uL (ref 0.0–0.1)
Basophils Relative: 0 %
EOS ABS: 0.2 10*3/uL (ref 0.0–0.7)
Eosinophils Relative: 1 %
HCT: 39.7 % (ref 39.0–52.0)
Hemoglobin: 12.7 g/dL — ABNORMAL LOW (ref 13.0–17.0)
LYMPHS PCT: 26 %
Lymphs Abs: 3.9 10*3/uL (ref 0.7–4.0)
MCH: 28.9 pg (ref 26.0–34.0)
MCHC: 32 g/dL (ref 30.0–36.0)
MCV: 90.2 fL (ref 78.0–100.0)
MONO ABS: 1.3 10*3/uL — AB (ref 0.1–1.0)
Monocytes Relative: 8 %
Neutro Abs: 9.9 10*3/uL — ABNORMAL HIGH (ref 1.7–7.7)
Neutrophils Relative %: 65 %
PLATELETS: 326 10*3/uL (ref 150–400)
RBC: 4.4 MIL/uL (ref 4.22–5.81)
RDW: 13.7 % (ref 11.5–15.5)
WBC: 15.3 10*3/uL — ABNORMAL HIGH (ref 4.0–10.5)

## 2018-03-20 NOTE — Discharge Summary (Signed)
Physician Discharge Summary  Bruce Mcgee ZOX:[id number] DOB: 1956-03-23 DOA: 03/18/2018  PCP: Richmond Campbell., PA-C  Admit date: 03/18/2018 Discharge date: 03/20/2018  Recommendations for Outpatient Follow-up:  1. Manage drain with daily emptying and measurement  Discharge Diagnoses:  Active Problems:   Incisional hernia   Surgical Procedure: open incisional hernia repair  Discharge Condition: Good Disposition: Home  Diet recommendation: regular diet   Hospital Course:  62 yo male presented for incisional hernia repair. Post operatively he was admitted to the hospital. POD 1 his drain looked sanguinous and WBC jumped to 24. POD 2 labs improved and drain became serosanguinous. He also had flatus. His pain was minimal throughout the stay and he was discharged home POD 2.  Discharge Instructions  Discharge Instructions    Call MD for:  difficulty breathing, headache or visual disturbances   Complete by:  As directed    Call MD for:  hives   Complete by:  As directed    Call MD for:  persistant nausea and vomiting   Complete by:  As directed    Call MD for:  redness, tenderness, or signs of infection (pain, swelling, redness, odor or green/yellow discharge around incision site)   Complete by:  As directed    Call MD for:  severe uncontrolled pain   Complete by:  As directed    Call MD for:  temperature >100.4   Complete by:  As directed    Diet - low sodium heart healthy   Complete by:  As directed    Discharge wound care:   Complete by:  As directed    Ok to shower tomorrow. Continue dry bandage daily. Continue daily drainage of bulb drain   Driving Restrictions   Complete by:  As directed    No driving while on narcotics   Increase activity slowly   Complete by:  As directed    Lifting restrictions   Complete by:  As directed    No lifting greater than 20 pounds for 3 weeks     Allergies as of 03/20/2018      Reactions   Adhesive [tape]    Pulls off skin       Medication List    STOP taking these medications   GOODY HEADACHE PO     TAKE these medications   albuterol 108 (90 Base) MCG/ACT inhaler Commonly known as:  PROVENTIL HFA;VENTOLIN HFA Inhale 2 puffs into the lungs every 4 (four) hours as needed for wheezing or shortness of breath.   HYDROcodone-acetaminophen 5-325 MG tablet Commonly known as:  NORCO/VICODIN Take 1 tablet by mouth every 6 (six) hours as needed for moderate pain.   ibuprofen 800 MG tablet Commonly known as:  ADVIL,MOTRIN Take 1 tablet (800 mg total) by mouth every 8 (eight) hours as needed.   levothyroxine 137 MCG tablet Commonly known as:  SYNTHROID, LEVOTHROID Take 137 mcg by mouth daily.            Discharge Care Instructions  (From admission, onward)        Start     Ordered   03/19/18 0000  Discharge wound care:    Comments:  Ok to shower tomorrow. Continue dry bandage daily. Continue daily drainage of bulb drain   03/19/18 1427        The results of significant diagnostics from this hospitalization (including imaging, microbiology, ancillary and laboratory) are listed below for reference.    Significant Diagnostic Studies: Ct Renal Stone Study  Result Date: 03/08/2018 CLINICAL DATA:  Sudden onset nausea and vomiting with left-sided abdominal pain starting tonight. EXAM: CT ABDOMEN AND PELVIS WITHOUT CONTRAST TECHNIQUE: Multidetector CT imaging of the abdomen and pelvis was performed following the standard protocol without IV contrast. COMPARISON:  07/11/2008 FINDINGS: Lower chest: No acute abnormality. Hepatobiliary: No focal liver abnormality is seen. No gallstones, gallbladder wall thickening, or biliary dilatation. Pancreas: Unremarkable. No pancreatic ductal dilatation or surrounding inflammatory changes. Spleen: Normal in size without focal abnormality. Adrenals/Urinary Tract: No adrenal gland nodules. Punctate sized stones in the right kidney. No hydronephrosis or hydroureter. No  ureteral stones or bladder stones. No bladder wall thickening. Stomach/Bowel: There is a large right anterior abdominal wall hernia arising through the junction between the rectus abdominus and right flank musculature. There is herniation of right colon, small bowel, and fat. There is prominent fat stranding and vascular engorgement which may indicate fat necrosis and vascular compromise. There is no evidence of proximal obstruction. Stomach, small bowel, and colon are all decompressed. Appendix is not identified but is likely within the hernia. Vascular/Lymphatic: Aortic atherosclerosis. No enlarged abdominal or pelvic lymph nodes. Reproductive: Prostate is unremarkable. Other: No free air or free fluid in the abdomen. Musculoskeletal: No acute or significant osseous findings. IMPRESSION: 1. Large right anterolateral hernia arising between the rectus abdominus and right flank muscles. There is herniation of large amounts of small bowel, right colon, and fat. Fat stranding and vascular engorgement suggests fat necrosis and vascular compromise. No bowel wall thickening or obstruction. 2. Tiny nonobstructing stones in the right kidney. No ureteral stone or obstruction. Electronically Signed   By: Burman Nieves M.D.   On: 03/08/2018 02:30    Labs: Basic Metabolic Panel: Recent Labs  Lab 03/17/18 0950 03/18/18 1718 03/19/18 0435  NA 141  --  141  K 3.8  --  4.9  CL 105  --  104  CO2 24  --  26  GLUCOSE 98  --  125*  BUN 13  --  14  CREATININE 0.79 0.98 0.91  CALCIUM 8.6*  --  8.6*   Liver Function Tests: No results for input(s): AST, ALT, ALKPHOS, BILITOT, PROT, ALBUMIN in the last 168 hours.  CBC: Recent Labs  Lab 03/17/18 0950 03/18/18 1718 03/19/18 0435 03/19/18 1219 03/20/18 0411  WBC 11.6* 21.3* 21.4* 24.2* 15.3*  NEUTROABS 7.9*  --   --  19.6* 9.9*  HGB 14.6 14.1 14.0 13.9 12.7*  HCT 43.4 43.5 43.2 43.0 39.7  MCV 89.9 90.1 89.8 90.3 90.2  PLT 304 317 336 350 326    CBG: No  results for input(s): GLUCAP in the last 168 hours.  Active Problems:   Incisional hernia   Time coordinating discharge: 

## 2018-03-20 NOTE — Progress Notes (Signed)
Assessment unchanged. Pt and wife verbalized understanding of dc instructions through teach back including when to call doctor, follow up care, and bulb drain care. Wife educated how to change dressing and assist with change prior to leaving. Pt demonstrated understanding on how to empty and recharge JP drain. Discharged via wc to front entrance accompanied by wife, son, and NT.

## 2018-03-22 NOTE — Anesthesia Postprocedure Evaluation (Signed)
Anesthesia Post Note  Patient: Bruce Mcgee  Procedure(s) Performed: INCISIONAL HERNIA REPAIR,  RECTRARECTAL TRANSVERSUS ABDOMINAL RELEASE (N/A Abdomen) INSERTION OF MESH (N/A Abdomen)     Patient location during evaluation: PACU Anesthesia Type: General Level of consciousness: awake and alert Pain management: pain level controlled Vital Signs Assessment: post-procedure vital signs reviewed and stable Respiratory status: spontaneous breathing, nonlabored ventilation, respiratory function stable and patient connected to nasal cannula oxygen Cardiovascular status: blood pressure returned to baseline and stable Postop Assessment: no apparent nausea or vomiting Anesthetic complications: no    Last Vitals:  Vitals:   03/19/18 2119 03/20/18 0555  BP: 112/75 119/79  Pulse: 73 85  Resp: 18 18  Temp: 36.5 C 36.8 C  SpO2: 93% 92%    Last Pain:  Vitals:   03/20/18 0815  TempSrc:   PainSc: 0-No pain   Pain Goal:                 Bruce Mcgee S

## 2020-02-10 ENCOUNTER — Ambulatory Visit: Payer: Self-pay | Attending: Internal Medicine

## 2020-02-10 ENCOUNTER — Ambulatory Visit: Payer: 59

## 2020-02-10 DIAGNOSIS — Z23 Encounter for immunization: Secondary | ICD-10-CM

## 2020-02-10 NOTE — Progress Notes (Signed)
   Covid-19 Vaccination Clinic  Name:  Bruce Mcgee    MRN: [id number] DOB: [birth year]  02/10/2020  Mr. Campion was observed post Covid-19 immunization for 15 minutes without incident. He was provided with Vaccine Information Sheet and instruction to access the V-Safe system.   Mr. Stanco was instructed to call 911 with any severe reactions post vaccine: Marland Kitchen Difficulty breathing  . Swelling of face and throat  . A fast heartbeat  . A bad rash all over body  . Dizziness and weakness   Immunizations Administered    Name Date Dose VIS Date Route   Moderna COVID-19 Vaccine 02/10/2020  8:37 AM 0.5 mL 10/11/2019 Intramuscular   Manufacturer: Moderna   Lot: [id number]   NDC: 53391-792-17

## 2020-03-13 ENCOUNTER — Ambulatory Visit: Payer: 59 | Attending: Internal Medicine

## 2020-03-13 DIAGNOSIS — Z23 Encounter for immunization: Secondary | ICD-10-CM

## 2020-03-13 NOTE — Progress Notes (Signed)
   Covid-19 Vaccination Clinic  Name:  Bruce Mcgee    MRN: [id number] DOB: 12-Sep-1956  03/13/2020  [name] was observed post Covid-19 immunization for 15 minutes without incident. He was provided with Vaccine Information Sheet and instruction to access the V-Safe system.   [name] was instructed to call 911 with any severe reactions post vaccine: Marland Kitchen Difficulty breathing  . Swelling of face and throat  . A fast heartbeat  . A bad rash all over body  . Dizziness and weakness   Immunizations Administered    Name Date Dose VIS Date Route   Moderna COVID-19 Vaccine 03/13/2020  8:45 AM 0.5 mL 10/2019 Intramuscular   Manufacturer: Moderna   Lot: [id number]   NDC: 20233-435-68

## 2020-09-19 ENCOUNTER — Emergency Department (HOSPITAL_COMMUNITY)
Admission: EM | Admit: 2020-09-19 | Discharge: 2020-09-19 | Disposition: A | Discharge status: Home / Self Care | Payer: 59 | Attending: Emergency Medicine | Admitting: Emergency Medicine

## 2020-09-19 ENCOUNTER — Other Ambulatory Visit: Payer: Self-pay

## 2020-09-19 DIAGNOSIS — W228XXA Striking against or struck by other objects, initial encounter: Secondary | ICD-10-CM | POA: Insufficient documentation

## 2020-09-19 DIAGNOSIS — S0101XA Laceration without foreign body of scalp, initial encounter: Secondary | ICD-10-CM | POA: Insufficient documentation

## 2020-09-19 DIAGNOSIS — Y92015 Private garage of single-family (private) house as the place of occurrence of the external cause: Secondary | ICD-10-CM | POA: Insufficient documentation

## 2020-09-19 DIAGNOSIS — F17210 Nicotine dependence, cigarettes, uncomplicated: Secondary | ICD-10-CM | POA: Insufficient documentation

## 2020-09-19 DIAGNOSIS — E039 Hypothyroidism, unspecified: Secondary | ICD-10-CM | POA: Insufficient documentation

## 2020-09-19 DIAGNOSIS — Z79899 Other long term (current) drug therapy: Secondary | ICD-10-CM | POA: Insufficient documentation

## 2020-09-19 MED ORDER — LIDOCAINE-EPINEPHRINE-TETRACAINE (LET) TOPICAL GEL
3.0000 mL | Freq: Once | TOPICAL | Status: AC
  Administered 2020-09-19: 3 mL via TOPICAL
  Filled 2020-09-19: qty 3

## 2020-09-19 NOTE — Discharge Instructions (Signed)
If you develop worsening headache, lightheadedness, dizziness, passing out please seek reevaluation the emergency department.
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
## 2020-09-19 NOTE — ED Provider Notes (Signed)
[facility] Provider Note   CSN: [id number] Arrival date & time: 09/19/20  1916    History Chief Complaint  Patient presents with  . Head Laceration    Lacretia LeighDonnie L Skirvin is a 64 y.o. male with past medical history significant for headache, renal stones, hypothyroidism who presents for evaluation of head laceration.  Patient states he went to pull down his garage door and the bottom of that sharp the top of his head.  He denies any syncope.  Patient states initially he had pain to this area however this has resolved.  Patient denies any LOC, anticoagulation.  He denies headache, lightness, dizziness, vision changes, facial droop, weakness, paresthesias.  Denies additional aggravating or alleviating factors.  Tetanus up to date  Rates current pain is 0/10.  History obtained from patient and past medical records.  No interpreter is used.  HPI     Past Medical History:  Diagnosis Date  . Headache   . History of kidney stones   . Hypothyroidism     Patient Active Problem List   Diagnosis Date Noted  . Incisional hernia 03/08/2018  . Abdominal wall hernia     Past Surgical History:  Procedure Laterality Date  . APPENDECTOMY    . INCISIONAL HERNIA REPAIR N/A 03/18/2018   Procedure: INCISIONAL HERNIA REPAIR,  RECTRARECTAL TRANSVERSUS ABDOMINAL RELEASE;  Surgeon: Kinsinger, De BlanchLuke Aaron, MD;  Location: [facility];  Service: General;  Laterality: N/A;  . INSERTION OF MESH N/A 03/18/2018   Procedure: INSERTION OF MESH;  Surgeon: Sheliah HatchKinsinger, De BlanchLuke Aaron, MD;  Location: [facility];  Service: General;  Laterality: N/A;  . LIPOMA EXCISION     on left shoulder       No family history on file.  Social History   Tobacco Use  . Smoking status: Current Every Day Smoker    Packs/day: 2.00    Types: Cigarettes  . Smokeless tobacco: Never Used  Vaping Use  . Vaping Use: Never used  Substance Use Topics  . Alcohol use: No  . Drug use: No    Home Medications Prior to  Admission medications   Medication Sig Start Date End Date Taking? Authorizing Provider  albuterol (PROVENTIL HFA;VENTOLIN HFA) 108 (90 Base) MCG/ACT inhaler Inhale 2 puffs into the lungs every 4 (four) hours as needed for wheezing or shortness of breath.  02/18/18   [provider]  HYDROcodone-acetaminophen (NORCO/VICODIN) 5-325 MG tablet Take 1 tablet by mouth every 6 (six) hours as needed for moderate pain. 03/19/18   Kinsinger, De BlanchLuke Aaron, MD  ibuprofen (ADVIL,MOTRIN) 800 MG tablet Take 1 tablet (800 mg total) by mouth every 8 (eight) hours as needed. 03/19/18   Kinsinger, De BlanchLuke Aaron, MD  levothyroxine (SYNTHROID, LEVOTHROID) 137 MCG tablet Take 137 mcg by mouth daily.     [provider]    Allergies    Adhesive [tape]  Review of Systems   Review of Systems  Constitutional: Negative.   HENT: Negative.   Respiratory: Negative.   Cardiovascular: Negative.   Gastrointestinal: Negative.   Genitourinary: Negative.   Musculoskeletal: Negative.   Skin: Positive for wound.  Neurological: Negative.   All other systems reviewed and are negative.  Physical Exam Updated Vital Signs BP 130/90 (BP Location: Right Arm)   Pulse 84   Temp 98.5 F (36.9 C) (Oral)   Resp 18   Ht 5\' 9"  (1.753 m)   Wt 81.6 kg   SpO2 93%   BMI 26.58 kg/m   Physical Exam  Physical Exam  Constitutional: Pt is oriented to person, place, and time. Pt appears well-developed and well-nourished. No distress.  HENT:  Head: Normocephalic. 4 cm laceration to frontal scalp. No hematoma. No hemotympanum, racoon eyes. Mouth/Throat: Oropharynx is clear and moist.  Eyes: Conjunctivae and EOM are normal. Pupils are equal, round, and reactive to light. No scleral icterus.  No horizontal, vertical or rotational nystagmus  Neck: Normal range of motion. Neck supple.  Full active and passive ROM without pain No midline or paraspinal tenderness No nuchal rigidity or meningeal signs  Cardiovascular: Normal  rate, regular rhythm and intact distal pulses.   Pulmonary/Chest: Effort normal and breath sounds normal. No respiratory distress. Pt has no wheezes. No rales.  Abdominal: Soft. Bowel sounds are normal. There is no tenderness. There is no rebound and no guarding.  Musculoskeletal: Normal range of motion.  Lymphadenopathy:    No cervical adenopathy.  Neurological: Pt. is alert and oriented to person, place, and time. He has normal reflexes. No cranial nerve deficit.  Exhibits normal muscle tone. Coordination normal.  Mental Status:  Alert, oriented, thought content appropriate. Speech fluent without evidence of aphasia. Able to follow 2 step commands without difficulty.  Cranial Nerves:  II:  Peripheral visual fields grossly normal, pupils equal, round, reactive to light III,IV, VI: ptosis not present, extra-ocular motions intact bilaterally  V,VII: smile symmetric, facial light touch sensation equal VIII: hearing grossly normal bilaterally  IX,X: midline uvula rise  XI: bilateral shoulder shrug equal and strong XII: midline tongue extension  Motor:  5/5 in upper and lower extremities bilaterally including strong and equal grip strength and dorsiflexion/plantar flexion Sensory: Pinprick and light touch normal in all extremities.  Deep Tendon Reflexes: 2+ and symmetric  Cerebellar: normal finger-to-nose with bilateral upper extremities Gait: normal gait and balance CV: distal pulses palpable throughout   Skin: Skin is warm and dry. No rash noted. Pt is not diaphoretic.  4 cm laceration to frontal scalp. No active bleeding or drainage.  Psychiatric: Pt has a normal mood and affect. Behavior is normal. Judgment and thought content normal.  Nursing note and vitals reviewed. ED Results / Procedures / Treatments   Labs (all labs ordered are listed, but only abnormal results are displayed) Labs Reviewed - No data to display  EKG None  Radiology No results  found.  Procedures .Marland KitchenLaceration Repair  Date/Time: 09/19/2020 8:13 PM Performed by: Linwood Dibbles, PA-C Authorized by: Linwood Dibbles, PA-C   Consent:    Consent obtained:  Verbal   Consent given by:  Patient   Risks discussed:  Infection, need for additional repair, pain, poor cosmetic result and poor wound healing   Alternatives discussed:  No treatment and delayed treatment Universal protocol:    Procedure explained and questions answered to patient or proxy's satisfaction: yes     Relevant documents present and verified: yes     Test results available and properly labeled: yes     Imaging studies available: yes     Required blood products, implants, devices, and special equipment available: yes     Site/side marked: yes     Immediately prior to procedure, a time out was called: yes     Patient identity confirmed:  Verbally with patient Anesthesia (see MAR for exact dosages):    Anesthesia method:  Local infiltration and topical application   Topical anesthetic:  LET Laceration details:    Location:  Scalp   Scalp location:  Frontal   Length (cm):  4   Depth (mm):  3 Repair type:    Repair type:  Intermediate Pre-procedure details:    Preparation:  Patient was prepped and draped in usual sterile fashion and imaging obtained to evaluate for foreign bodies Exploration:    Hemostasis achieved with:  Direct pressure   Wound exploration: wound explored through full range of motion and entire depth of wound probed and visualized     Contaminated: no   Treatment:    Area cleansed with:  Betadine   Amount of cleaning:  Extensive   Irrigation solution:  Sterile saline   Irrigation method:  Pressure wash   Visualized foreign bodies/material removed: no   Skin repair:    Repair method:  Staples   Number of staples:  4 Approximation:    Approximation:  Close Post-procedure details:    Dressing:  Open (no dressing)   Patient tolerance of procedure:  Tolerated well,  no immediate complications   (including critical care time)  Medications Ordered in ED Medications  lidocaine-EPINEPHrine-tetracaine (LET) topical gel (3 mLs Topical Given 09/19/20 1953)   ED Course  I have reviewed the triage vital signs and the nursing notes.  Pertinent labs & imaging results that were available during my care of the patient were reviewed by me and considered in my medical decision making (see chart for details).  64 year old presents for evaluation of head laceration.  Was lifting up his garage door and subsequently walked into this sustaining a laceration to his scalp.  He denies any syncope, headache, lightheadedness or dizziness.  Has a nonfocal neuro exam without deficits.  Does have 4 cm laceration to frontal aspect of scalp.  No active bleeding or drainage.  Patient appears overall well.  No emesis.  He denies any anticoagulation or LOC.  Shared decision making for head imaging.  Patient states he prefers to return if he develops any symptoms and declines imaging at this time.  I feels is reasonable given his reassuring exam.  #4 staples placed to his scalp.  We will have him follow-up outpatient for removal.  I discussed return precautions.  Patient voiced understanding and is agreeable for follow-up.  The patient has been appropriately medically screened and/or stabilized in the ED. I have low suspicion for any other emergent medical condition which would require further screening, evaluation or treatment in the ED or require inpatient management.  Patient is hemodynamically stable and in no acute distress.  Patient able to ambulate in department prior to ED.  Evaluation does not show acute pathology that would require ongoing or additional emergent interventions while in the emergency department or further inpatient treatment.  I have discussed the diagnosis with the patient and answered all questions.  Pain is been managed while in the emergency department and patient  has no further complaints prior to discharge.  Patient is comfortable with plan discussed in room and is stable for discharge at this time.  I have discussed strict return precautions for returning to the emergency department.  Patient was encouraged to follow-up with PCP/specialist refer to at discharge.      MDM Rules/Calculators/A&P                          Final Clinical Impression(s) / ED Diagnoses Final diagnoses:  Laceration of scalp, initial encounter    Rx / DC Orders ED Discharge Orders    None       Georgeanna Radziewicz A, PA-C 09/19/20  2019    Pricilla Loveless, MD 09/21/20 [phone]

## 2021-10-18 LAB — COLOGUARD: COLOGUARD: POSITIVE — AB

## 2022-03-02 ENCOUNTER — Emergency Department (HOSPITAL_COMMUNITY): Payer: BC Managed Care – PPO

## 2022-03-02 ENCOUNTER — Encounter (HOSPITAL_COMMUNITY): Payer: Self-pay | Admitting: Emergency Medicine

## 2022-03-02 ENCOUNTER — Inpatient Hospital Stay (HOSPITAL_COMMUNITY)
Admission: EM | Admit: 2022-03-02 | Discharge: 2022-03-04 | DRG: 247 | Disposition: A | Discharge status: Home / Self Care | Payer: BC Managed Care – PPO | Attending: Cardiovascular Disease | Admitting: Cardiovascular Disease

## 2022-03-02 ENCOUNTER — Other Ambulatory Visit: Payer: Self-pay

## 2022-03-02 DIAGNOSIS — I2119 ST elevation (STEMI) myocardial infarction involving other coronary artery of inferior wall: Secondary | ICD-10-CM | POA: Diagnosis not present

## 2022-03-02 DIAGNOSIS — Z79899 Other long term (current) drug therapy: Secondary | ICD-10-CM

## 2022-03-02 DIAGNOSIS — I2582 Chronic total occlusion of coronary artery: Secondary | ICD-10-CM | POA: Diagnosis present

## 2022-03-02 DIAGNOSIS — I2510 Atherosclerotic heart disease of native coronary artery without angina pectoris: Secondary | ICD-10-CM | POA: Diagnosis present

## 2022-03-02 DIAGNOSIS — Z91048 Other nonmedicinal substance allergy status: Secondary | ICD-10-CM

## 2022-03-02 DIAGNOSIS — Z720 Tobacco use: Secondary | ICD-10-CM

## 2022-03-02 DIAGNOSIS — I252 Old myocardial infarction: Secondary | ICD-10-CM

## 2022-03-02 DIAGNOSIS — Z955 Presence of coronary angioplasty implant and graft: Secondary | ICD-10-CM | POA: Diagnosis not present

## 2022-03-02 DIAGNOSIS — F17210 Nicotine dependence, cigarettes, uncomplicated: Secondary | ICD-10-CM | POA: Diagnosis present

## 2022-03-02 DIAGNOSIS — E782 Mixed hyperlipidemia: Secondary | ICD-10-CM | POA: Diagnosis present

## 2022-03-02 DIAGNOSIS — Z87442 Personal history of urinary calculi: Secondary | ICD-10-CM

## 2022-03-02 DIAGNOSIS — Z79890 Hormone replacement therapy (postmenopausal): Secondary | ICD-10-CM

## 2022-03-02 DIAGNOSIS — Z20822 Contact with and (suspected) exposure to covid-19: Secondary | ICD-10-CM | POA: Diagnosis present

## 2022-03-02 DIAGNOSIS — E039 Hypothyroidism, unspecified: Secondary | ICD-10-CM | POA: Diagnosis present

## 2022-03-02 DIAGNOSIS — I2111 ST elevation (STEMI) myocardial infarction involving right coronary artery: Principal | ICD-10-CM | POA: Diagnosis present

## 2022-03-02 DIAGNOSIS — I213 ST elevation (STEMI) myocardial infarction of unspecified site: Secondary | ICD-10-CM | POA: Diagnosis not present

## 2022-03-02 HISTORY — DX: ST elevation (STEMI) myocardial infarction involving other coronary artery of inferior wall: I21.19

## 2022-03-02 LAB — PROTIME-INR
INR: 0.9 (ref 0.8–1.2)
Prothrombin Time: 12.2 seconds (ref 11.4–15.2)

## 2022-03-02 LAB — APTT: aPTT: 27 s (ref 24–36)

## 2022-03-02 MED ORDER — ONDANSETRON HCL 4 MG/2ML IJ SOLN: 4.0000 mg | Freq: Four times a day (QID) | INTRAMUSCULAR | Status: DC | PRN

## 2022-03-02 MED ORDER — ASPIRIN EC 81 MG PO TBEC: 81.0000 mg | DELAYED_RELEASE_TABLET | Freq: Every day | ORAL | Status: DC

## 2022-03-02 MED ORDER — HEPARIN SODIUM (PORCINE) 5000 UNIT/ML IJ SOLN
INTRAMUSCULAR | Status: AC
  Filled 2022-03-02: qty 1

## 2022-03-02 MED ORDER — NITROGLYCERIN 0.4 MG SL SUBL
0.4000 mg | SUBLINGUAL_TABLET | SUBLINGUAL | Status: DC | PRN
  Filled 2022-03-02: qty 1

## 2022-03-02 MED ORDER — ASPIRIN 81 MG PO CHEW: 324.0000 mg | CHEWABLE_TABLET | Freq: Once | ORAL | Status: DC

## 2022-03-02 MED ORDER — ACETAMINOPHEN 325 MG PO TABS: 650.0000 mg | ORAL_TABLET | ORAL | Status: DC | PRN

## 2022-03-02 MED ORDER — HEPARIN SODIUM (PORCINE) 5000 UNIT/ML IJ SOLN
4000.0000 [IU] | Freq: Once | INTRAMUSCULAR | Status: AC
  Administered 2022-03-02: 4000 [IU] via INTRAVENOUS

## 2022-03-02 MED ORDER — ATORVASTATIN CALCIUM 80 MG PO TABS
80.0000 mg | ORAL_TABLET | Freq: Every day | ORAL | Status: DC
  Administered 2022-03-03 – 2022-03-04 (×2): 80 mg via ORAL
  Filled 2022-03-02 (×2): qty 1

## 2022-03-02 MED ORDER — SODIUM CHLORIDE 0.9 % IV SOLN: INTRAVENOUS | Status: DC

## 2022-03-02 MED ORDER — LEVOTHYROXINE SODIUM 137 MCG PO TABS
137.0000 ug | ORAL_TABLET | Freq: Every day | ORAL | Status: DC
  Administered 2022-03-03 – 2022-03-04 (×2): 137 ug via ORAL
  Filled 2022-03-02 (×2): qty 1

## 2022-03-02 MED ORDER — TICAGRELOR 90 MG PO TABS
90.0000 mg | ORAL_TABLET | Freq: Two times a day (BID) | ORAL | Status: DC
  Administered 2022-03-03 – 2022-03-04 (×3): 90 mg via ORAL
  Filled 2022-03-02 (×3): qty 1

## 2022-03-02 MED ORDER — ALBUTEROL SULFATE (2.5 MG/3ML) 0.083% IN NEBU: 2.5000 mg | INHALATION_SOLUTION | RESPIRATORY_TRACT | Status: DC | PRN

## 2022-03-02 NOTE — ED Triage Notes (Signed)
Pt bib ems from home as code stemi. Pt has been having intermittent chest pain since approx 0900 today that he describes at pressure in his central chest along with shob. 8mg  morphine and 324 aspirin given PTA.  ? ?BP 170/90, HR 70, Spo2: 99% Ra ?

## 2022-03-02 NOTE — H&P (Signed)
?Cardiology Admission History and Physical:  ? ?Patient ID: Bruce Mcgee ?MRN: [id number]; DOB: [birth year]  ? ?Admission date: 03/02/2022 ? ?PCP:  Richmond CampbellKaplan, Kristen W., PA-C ?  ?[facility] Providers ?Cardiologist:  None      ? ? ?Chief Complaint:  chest pain ? ?Patient Profile:  ? ?Bruce Mcgee is a 66 y.o. male with Hypothyroidism, HLD, Tobacco use dx who is being seen 03/02/2022 for the evaluation of chest pain/Acute inferior ST elevation MI. ? ?History of Present Illness:  ? ?Bruce Mcgee is a 66 y.o. male with Hypothyroidism, HLD, Tobacco use dx who is being seen 03/02/2022 for the evaluation of chest pain/Acute inferior ST elevation MI. ? ?Patient reports chest pain starting 6 am this morning, initially felt like heaviness in his chest and acid reflex so took prilosec in the morning and the pain got better, no chest pain throughout the day, again started back at night 10pm, severe in intensity, non radiating. Took full dose aspirin at home, brought by EMS and got IV morphine in the ER with decrease in intensity of pain to 4/10 ? ?EKG: acute inferior STEMI (ST elevations in inferior leads) and ST depression in V2.  ?BP 170/90, HR 70, Spo2: 99% Ra ? ?Denies any bleeding problems, cva, prior h/o chest pain or MI ?Active tobacco use dx: 2ppd- long time smoker. ? ?Past Medical History:  ?Diagnosis Date  ? 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 Headache   ? History of kidney stones   ? Hypothyroidism   ? ? ?Past Surgical History:  ?Procedure Laterality Date  ? APPENDECTOMY    ? INCISIONAL HERNIA REPAIR N/A 03/18/2018  ? Procedure: INCISIONAL HERNIA REPAIR,  RECTRARECTAL TRANSVERSUS ABDOMINAL RELEASE;  Surgeon: Kinsinger, De BlanchLuke Aaron, MD;  Location: [facility];  Service: General;  Laterality: N/A;  ? INSERTION OF MESH N/A 03/18/2018  ? Procedure: INSERTION OF MESH;  Surgeon: Kinsinger, De BlanchLuke Aaron, MD;  Location: [facility];  Service: General;  Laterality: N/A;  ? LIPOMA EXCISION     ? on left shoulder  ?  ? ?Medications Prior to Admission: ?Prior to Admission medications   ?Medication Sig Start Date End Date Taking? Authorizing Provider  ?albuterol (PROVENTIL HFA;VENTOLIN HFA) 108 (90 Base) MCG/ACT inhaler Inhale 2 puffs into the lungs every 4 (four) hours as needed for wheezing or shortness of breath.  02/18/18   [provider]  ?HYDROcodone-acetaminophen (NORCO/VICODIN) 5-325 MG tablet Take 1 tablet by mouth every 6 (six) hours as needed for moderate pain. 03/19/18   Kinsinger, De BlanchLuke Aaron, MD  ?ibuprofen (ADVIL,MOTRIN) 800 MG tablet Take 1 tablet (800 mg total) by mouth every 8 (eight) hours as needed. 03/19/18   Kinsinger, De BlanchLuke Aaron, MD  ?levothyroxine (SYNTHROID, LEVOTHROID) 137 MCG tablet Take 137 mcg by mouth daily.     [provider]  ?  ? ?Allergies:    ?Allergies  ?Allergen Reactions  ? Adhesive [Tape]   ?  Pulls off skin   ? ? ?Social History:   ?Social History  ? ?Socioeconomic History  ? Marital status: Married  ?  Spouse name: Not on file  ? Number of children: Not on file  ? Years of education: Not on file  ? Highest education level: Not on file  ?Occupational History  ? Not on file  ?Tobacco Use  ? Smoking status: Every Day  ?  Packs/day: 2.00  ?  Types: Cigarettes  ? Smokeless tobacco: Never  ?Vaping Use  ?  Vaping Use: Never used  ?Substance and Sexual Activity  ? Alcohol use: No  ? Drug use: No  ? Sexual activity: Not on file  ?Other Topics Concern  ? Not on file  ?Social History Narrative  ? Not on file  ? ?Social Determinants of Health  ? ?Financial Resource Strain: Not on file  ?Food Insecurity: Not on file  ?Transportation Needs: Not on file  ?Physical Activity: Not on file  ?Stress: Not on file  ?Social Connections: Not on file  ?Intimate Partner Violence: Not on file  ?  ?Family History:   ?The patient's family history is not on file.   ? ?ROS:  ?Please see the history of present illness.  ?All other ROS reviewed and negative.    ? ?Physical  Exam/Data:  ? ?Vitals:  ? 03/02/22 2325 03/02/22 2326 03/02/22 2327 03/02/22 2330  ?BP: (!) 148/92   137/85  ?Pulse: 68   70  ?Resp: (!) 23   (!) 22  ?Temp:  (!) 97.4 ?F (36.3 ?C)    ?TempSrc:  Oral    ?SpO2: 96%   95%  ?Weight:   83 kg   ?Height:   5\' 9"  (1.753 m)   ? ?No intake or output data in the 24 hours ending 03/02/22 2357 ? ?  03/02/2022  ? 11:27 PM 09/19/2020  ?  7:27 PM 03/18/2018  ? 11:41 AM  ?Last 3 Weights  ?Weight (lbs) 183 lb 180 lb 181 lb  ?Weight (kg) 83.008 kg 81.647 kg 82.101 kg  ?   ?Body mass index is 27.02 kg/m?.  ?General:  Well nourished, well developed, in no acute distress ?HEENT: normal ?Neck: no JVD ?Vascular: No carotid bruits; Distal pulses 2+ bilaterally   ?Cardiac:  normal S1, S2; RRR; no murmur  ?Lungs:  clear to auscultation bilaterally, no wheezing, rhonchi or rales  ?Abd: soft, nontender, no hepatomegaly  ?Ext: no edema ?Musculoskeletal:  No deformities, BUE and BLE strength normal and equal ?Skin: warm and dry  ?Neuro:  CNs 2-12 intact, no focal abnormalities noted ?Psych:  Normal affect  ? ? ? ?Laboratory Data: ? ?High Sensitivity Troponin:  No results for input(s): TROPONINIHS in the last 720 hours.    ?ChemistryNo results for input(s): NA, K, CL, CO2, GLUCOSE, BUN, CREATININE, CALCIUM, MG, GFRNONAA, GFRAA, ANIONGAP in the last 168 hours.  ?No results for input(s): PROT, ALBUMIN, AST, ALT, ALKPHOS, BILITOT in the last 168 hours. ?Lipids No results for input(s): CHOL, TRIG, HDL, LABVLDL, LDLCALC, CHOLHDL in the last 168 hours. ?HematologyNo results for input(s): WBC, RBC, HGB, HCT, MCV, MCH, MCHC, RDW, PLT in the last 168 hours. ?Thyroid No results for input(s): TSH, FREET4 in the last 168 hours. ?BNPNo results for input(s): BNP, PROBNP in the last 168 hours.  ?DDimer No results for input(s): DDIMER in the last 168 hours. ? ? ?Radiology/Studies:  ?DG Chest Port 1 View ? ?Result Date: 03/02/2022 ?CLINICAL DATA:  Chest pain, shortness of breath EXAM: PORTABLE CHEST 1 VIEW COMPARISON:   12/19/2016 FINDINGS: Lungs are clear.  No pleural effusion or pneumothorax. The heart is normal in size. IMPRESSION: No evidence of acute cardiopulmonary disease. Electronically Signed   By: 02/16/2017 M.D.   On: 03/02/2022 23:43   ? ? ?Assessment and Plan:  ? ?Chest pain/ Acute inferior STEMI  ?Tobacco use dx ?Hypothyroidism ? ?Plan: ?- CODE STEMI activated, emergent CATH lab ? LHC/CA: mid RCA 100% occluded s/p PCI ? - continue aspirin 81mg , atorva 80mg  daily, will start  metoprolol tart from tomorrow ?- ECHO in am ?- A1c, Lipid panel pending ?- quit smoking, aggressive risk factor modification ?- full code ? ? ?Risk Assessment/Risk Scores:  ?  ?TIMI Risk Score for ST  Elevation MI:   ?The patient's TIMI risk score is 0, which indicates a 0.8% risk of all cause mortality at 30 days.  ?  ?  ? ? ?Severity of Illness: ?The appropriate patient status for this patient is INPATIENT. Inpatient status is judged to be reasonable and necessary in order to provide the required intensity of service to ensure the patient's safety. The patient's presenting symptoms, physical exam findings, and initial radiographic and laboratory data in the context of their chronic comorbidities is felt to place them at high risk for further clinical deterioration. Furthermore, it is not anticipated that the patient will be medically stable for discharge from the hospital within 2 midnights of admission.  ? ?* I certify that at the point of admission it is my clinical judgment that the patient will require inpatient hospital care spanning beyond 2 midnights from the point of admission due to high intensity of service, high risk for further deterioration and high frequency of surveillance required.*  ? ?For questions or updates, please contact [facility] ?Please consult [email or web address] for contact info under  ? ?  ?Signed, ?Elmon Kirschner, MD  ?03/02/2022 11:57 PM   ?

## 2022-03-02 NOTE — ED Provider Notes (Signed)
?[place] ?Provider Note ? ? ?CSN: ID:[id number] ?Arrival date & time:    ? ?  ? ?History ? ?Chief Complaint  ?Patient presents with  ? Code STEMI  ? ? ?Bruce Mcgee is a 66 y.o. male. ? ?Patient presents to the emergency department for evaluation of chest pain.  He comes to the emergency department by ambulance from home.  Patient reports that he started to notice pain around 930 this morning.  It was fairly severe initially but then eased off.  He reports mild pain throughout the day, then worsening pain tonight.  He has been short of breath with this as well.  Patient denies any cardiac history. ? ? ?  ? ?Home Medications ?Prior to Admission medications   ?Medication Sig Start Date End Date Taking? Authorizing Provider  ?albuterol (PROVENTIL HFA;VENTOLIN HFA) 108 (90 Base) MCG/ACT inhaler Inhale 2 puffs into the lungs every 4 (four) hours as needed for wheezing or shortness of breath.  02/18/18   [provider]  ?HYDROcodone-acetaminophen (NORCO/VICODIN) 5-325 MG tablet Take 1 tablet by mouth every 6 (six) hours as needed for moderate pain. 03/19/18   Kinsinger, Arta Bruce, MD  ?ibuprofen (ADVIL,MOTRIN) 800 MG tablet Take 1 tablet (800 mg total) by mouth every 8 (eight) hours as needed. 03/19/18   Kinsinger, Arta Bruce, MD  ?levothyroxine (SYNTHROID, LEVOTHROID) 137 MCG tablet Take 137 mcg by mouth daily.     [provider]  ?   ? ?Allergies    ?Adhesive [tape]   ? ?Review of Systems   ?Review of Systems  ?Respiratory:  Positive for shortness of breath.   ?Cardiovascular:  Positive for chest pain.  ? ?Physical Exam ?Updated Vital Signs ?Temp (!) 97.4 ?F (36.3 ?C) (Oral)   Ht 5\' 9"  (1.753 m)   Wt 83 kg   BMI 27.02 kg/m?  ?Physical Exam ?Vitals and nursing note reviewed.  ?Constitutional:   ?   General: He is not in acute distress. ?   Appearance: He is well-developed.  ?HENT:  ?   Head: Normocephalic and atraumatic.  ?   Mouth/Throat:  ?   Mouth: Mucous  membranes are moist.  ?Eyes:  ?   General: Vision grossly intact. Gaze aligned appropriately.  ?   Extraocular Movements: Extraocular movements intact.  ?   Conjunctiva/sclera: Conjunctivae normal.  ?Cardiovascular:  ?   Rate and Rhythm: Normal rate and regular rhythm.  ?   Pulses: Normal pulses.  ?   Heart sounds: Normal heart sounds, S1 normal and S2 normal. No murmur heard. ?  No friction rub. No gallop.  ?Pulmonary:  ?   Effort: Pulmonary effort is normal. No respiratory distress.  ?   Breath sounds: Normal breath sounds.  ?Abdominal:  ?   Palpations: Abdomen is soft.  ?   Tenderness: There is no abdominal tenderness. There is no guarding or rebound.  ?   Hernia: No hernia is present.  ?Musculoskeletal:     ?   General: No swelling.  ?   Cervical back: Full passive range of motion without pain, normal range of motion and neck supple. No pain with movement, spinous process tenderness or muscular tenderness. Normal range of motion.  ?   Right lower leg: No edema.  ?   Left lower leg: No edema.  ?Skin: ?   General: Skin is warm and dry.  ?   Capillary Refill: Capillary refill takes less than 2 seconds.  ?   Findings:  No ecchymosis, erythema, lesion or wound.  ?Neurological:  ?   Mental Status: He is alert and oriented to person, place, and time.  ?   GCS: GCS eye subscore is 4. GCS verbal subscore is 5. GCS motor subscore is 6.  ?   Cranial Nerves: Cranial nerves 2-12 are intact.  ?   Sensory: Sensation is intact.  ?   Motor: Motor function is intact. No weakness or abnormal muscle tone.  ?   Coordination: Coordination is intact.  ?Psychiatric:     ?   Mood and Affect: Mood normal.     ?   Speech: Speech normal.     ?   Behavior: Behavior normal.  ? ? ?ED Results / Procedures / Treatments   ?Labs ?(all labs ordered are listed, but only abnormal results are displayed) ?Labs Reviewed  ?RESP PANEL BY RT-PCR (FLU A&B, COVID) ARPGX2  ?HEMOGLOBIN A1C  ?CBC WITH DIFFERENTIAL/PLATELET  ?PROTIME-INR  ?APTT  ?COMPREHENSIVE  METABOLIC PANEL  ?LIPID PANEL  ?TROPONIN I (HIGH SENSITIVITY)  ? ? ?EKG ?None ? ?Radiology ?No results found. ? ?Procedures ?Procedures  ? ? ?Medications Ordered in ED ?Medications  ?0.9 %  sodium chloride infusion ( Intravenous New Bag/Given 03/02/22 2331)  ?aspirin chewable tablet 324 mg (has no administration in time range)  ?heparin injection 4,000 Units (4,000 Units Intravenous Given 03/02/22 2330)  ? ? ?ED Course/ Medical Decision Making/ A&P ?  ?                        ?Medical Decision Making ?Amount and/or Complexity of Data Reviewed ?Labs: ordered. ?Radiology: ordered. ? ?Risk ?OTC drugs. ?Prescription drug management. ? ? ?Prehospital EKG concerning for inferior ST elevation MI.  Patient administered aspirin by EMS.  Nitroglycerin withheld because of inferior territory.  Patient had 7 out of 10 pain initially, was hypertensive.  Patient has received a total of morphine 8 mg during transport and reports that his symptoms are much improved, but still endorsing pain. ? ?EKG here still consistent with STEMI in the inferior territory. ? ?CRITICAL CARE ?Performed by: Orpah Greek ? ? ?Total critical care time: 35 minutes ? ?Critical care time was exclusive of separately billable procedures and treating other patients. ? ?Critical care was necessary to treat or prevent imminent or life-threatening deterioration. ? ?Critical care was time spent personally by me on the following activities: development of treatment plan with patient and/or surrogate as well as nursing, discussions with consultants, evaluation of patient's response to treatment, examination of patient, obtaining history from patient or surrogate, ordering and performing treatments and interventions, ordering and review of laboratory studies, ordering and review of radiographic studies, pulse oximetry and re-evaluation of patient's condition. ? ? ? ? ? ? ? ? ?Final Clinical Impression(s) / ED Diagnoses ?Final diagnoses:  ?ST elevation  myocardial infarction involving right coronary artery ([place])  ? ? ?Rx / DC Orders ?ED Discharge Orders   ? ? None  ? ?  ? ? ?  ?Orpah Greek, MD ?03/02/22 2333 ? ?

## 2022-03-03 ENCOUNTER — Encounter (HOSPITAL_COMMUNITY)
Admission: EM | Disposition: A | Discharge status: Home / Self Care | Payer: Self-pay | Source: Home / Self Care | Attending: Cardiovascular Disease

## 2022-03-03 ENCOUNTER — Other Ambulatory Visit (HOSPITAL_COMMUNITY): Payer: Self-pay

## 2022-03-03 ENCOUNTER — Encounter (HOSPITAL_COMMUNITY): Payer: Self-pay | Admitting: Cardiovascular Disease

## 2022-03-03 ENCOUNTER — Inpatient Hospital Stay (HOSPITAL_COMMUNITY): Payer: BC Managed Care – PPO

## 2022-03-03 DIAGNOSIS — I2510 Atherosclerotic heart disease of native coronary artery without angina pectoris: Secondary | ICD-10-CM

## 2022-03-03 DIAGNOSIS — I213 ST elevation (STEMI) myocardial infarction of unspecified site: Secondary | ICD-10-CM | POA: Diagnosis present

## 2022-03-03 DIAGNOSIS — I2119 ST elevation (STEMI) myocardial infarction involving other coronary artery of inferior wall: Secondary | ICD-10-CM

## 2022-03-03 DIAGNOSIS — I2111 ST elevation (STEMI) myocardial infarction involving right coronary artery: Principal | ICD-10-CM

## 2022-03-03 HISTORY — PX: LEFT HEART CATH AND CORONARY ANGIOGRAPHY: CATH118249

## 2022-03-03 HISTORY — PX: CORONARY/GRAFT ACUTE MI REVASCULARIZATION: CATH118305

## 2022-03-03 LAB — CBC WITH DIFFERENTIAL/PLATELET
Abs Immature Granulocytes: 0.07 10*3/uL (ref 0.00–0.07)
Basophils Absolute: 0.1 10*3/uL (ref 0.0–0.1)
Basophils Relative: 1 %
Eosinophils Absolute: 0.4 10*3/uL (ref 0.0–0.5)
Eosinophils Relative: 2 %
HCT: 47.4 % (ref 39.0–52.0)
Hemoglobin: 15.4 g/dL (ref 13.0–17.0)
Immature Granulocytes: 0 %
Lymphocytes Relative: 39 %
Lymphs Abs: 6.3 10*3/uL — ABNORMAL HIGH (ref 0.7–4.0)
MCH: 29.3 pg (ref 26.0–34.0)
MCHC: 32.5 g/dL (ref 30.0–36.0)
MCV: 90.3 fL (ref 80.0–100.0)
Monocytes Absolute: 1.2 10*3/uL — ABNORMAL HIGH (ref 0.1–1.0)
Monocytes Relative: 8 %
Neutro Abs: 7.9 10*3/uL — ABNORMAL HIGH (ref 1.7–7.7)
Neutrophils Relative %: 50 %
Platelets: 306 10*3/uL (ref 150–400)
RBC: 5.25 MIL/uL (ref 4.22–5.81)
RDW: 13.6 % (ref 11.5–15.5)
WBC: 16 10*3/uL — ABNORMAL HIGH (ref 4.0–10.5)
nRBC: 0 % (ref 0.0–0.2)

## 2022-03-03 LAB — LIPID PANEL
Cholesterol: 197 mg/dL (ref 0–200)
HDL: 25 mg/dL — ABNORMAL LOW (ref >40)
LDL Cholesterol: UNDETERMINED mg/dL (ref 0–99)
Total CHOL/HDL Ratio: 7.9 RATIO
Triglycerides: 431 mg/dL — ABNORMAL HIGH (ref <150)
VLDL: UNDETERMINED mg/dL (ref 0–40)

## 2022-03-03 LAB — COMPREHENSIVE METABOLIC PANEL
ALT: 30 U/L (ref 0–44)
AST: 20 U/L (ref 15–41)
Albumin: 3.3 g/dL — ABNORMAL LOW (ref 3.5–5.0)
Alkaline Phosphatase: 91 U/L (ref 38–126)
Anion gap: 8 (ref 5–15)
BUN: 15 mg/dL (ref 8–23)
CO2: 24 mmol/L (ref 22–32)
Calcium: 8.3 mg/dL — ABNORMAL LOW (ref 8.9–10.3)
Chloride: 105 mmol/L (ref 98–111)
Creatinine, Ser: 0.97 mg/dL (ref 0.61–1.24)
GFR, Estimated: >60 mL/min (ref >60)
Glucose, Bld: 137 mg/dL — ABNORMAL HIGH (ref 70–99)
Potassium: 3.5 mmol/L (ref 3.5–5.1)
Sodium: 137 mmol/L (ref 135–145)
Total Bilirubin: 0.6 mg/dL (ref 0.3–1.2)
Total Protein: 6.6 g/dL (ref 6.5–8.1)

## 2022-03-03 LAB — BASIC METABOLIC PANEL
Anion gap: 8 (ref 5–15)
BUN: 14 mg/dL (ref 8–23)
CO2: 23 mmol/L (ref 22–32)
Calcium: 8.4 mg/dL — ABNORMAL LOW (ref 8.9–10.3)
Chloride: 107 mmol/L (ref 98–111)
Creatinine, Ser: 0.96 mg/dL (ref 0.61–1.24)
GFR, Estimated: >60 mL/min (ref >60)
Glucose, Bld: 136 mg/dL — ABNORMAL HIGH (ref 70–99)
Potassium: 4 mmol/L (ref 3.5–5.1)
Sodium: 138 mmol/L (ref 135–145)

## 2022-03-03 LAB — CBC
HCT: 43.8 % (ref 39.0–52.0)
Hemoglobin: 14.8 g/dL (ref 13.0–17.0)
MCH: 29.7 pg (ref 26.0–34.0)
MCHC: 33.8 g/dL (ref 30.0–36.0)
MCV: 87.8 fL (ref 80.0–100.0)
Platelets: 280 10*3/uL (ref 150–400)
RBC: 4.99 MIL/uL (ref 4.22–5.81)
RDW: 13.7 % (ref 11.5–15.5)
WBC: 18.4 10*3/uL — ABNORMAL HIGH (ref 4.0–10.5)
nRBC: 0 % (ref 0.0–0.2)

## 2022-03-03 LAB — ECHOCARDIOGRAM COMPLETE
Area-P 1/2: 2.95 cm2
Calc EF: 56.9 %
Height: 69 in
S' Lateral: 3.2 cm
Single Plane A2C EF: 50.7 %
Single Plane A4C EF: 64.2 %
Weight: 3086.44 oz

## 2022-03-03 LAB — MRSA NEXT GEN BY PCR, NASAL: MRSA by PCR Next Gen: NOT DETECTED

## 2022-03-03 LAB — POCT ACTIVATED CLOTTING TIME
Activated Clotting Time: 227 seconds
Activated Clotting Time: 257 seconds

## 2022-03-03 LAB — HEMOGLOBIN A1C
Hgb A1c MFr Bld: 6 % — ABNORMAL HIGH (ref 4.8–5.6)
Mean Plasma Glucose: 125.5 mg/dL

## 2022-03-03 LAB — TROPONIN I (HIGH SENSITIVITY)
Troponin I (High Sensitivity): 153 ng/L (ref <18)
Troponin I (High Sensitivity): >24000 ng/L (ref <18)

## 2022-03-03 LAB — RESP PANEL BY RT-PCR (FLU A&B, COVID) ARPGX2
Influenza A by PCR: NEGATIVE
Influenza B by PCR: NEGATIVE
SARS Coronavirus 2 by RT PCR: NEGATIVE

## 2022-03-03 LAB — LDL CHOLESTEROL, DIRECT: Direct LDL: 103 mg/dL — ABNORMAL HIGH (ref 0–99)

## 2022-03-03 SURGERY — CORONARY/GRAFT ACUTE MI REVASCULARIZATION
Anesthesia: LOCAL

## 2022-03-03 MED ORDER — CLOPIDOGREL BISULFATE 75 MG PO TABS: 75.0000 mg | ORAL_TABLET | Freq: Every day | ORAL | Status: DC

## 2022-03-03 MED ORDER — HEPARIN (PORCINE) IN NACL 1000-0.9 UT/500ML-% IV SOLN
INTRAVENOUS | Status: AC
  Filled 2022-03-03: qty 1000

## 2022-03-03 MED ORDER — SODIUM CHLORIDE 0.9% FLUSH
3.0000 mL | Freq: Two times a day (BID) | INTRAVENOUS | Status: DC
  Administered 2022-03-03 – 2022-03-04 (×2): 3 mL via INTRAVENOUS

## 2022-03-03 MED ORDER — HEPARIN SODIUM (PORCINE) 1000 UNIT/ML IJ SOLN
INTRAMUSCULAR | Status: DC | PRN
Start: 2022-03-03 — End: 2022-03-03
  Administered 2022-03-03: 6000 [IU] via INTRAVENOUS
  Administered 2022-03-03: 2500 [IU] via INTRAVENOUS
  Administered 2022-03-03: 5000 [IU] via INTRAVENOUS

## 2022-03-03 MED ORDER — HYDRALAZINE HCL 20 MG/ML IJ SOLN: 10.0000 mg | INTRAMUSCULAR | Status: AC | PRN

## 2022-03-03 MED ORDER — ONDANSETRON HCL 4 MG/2ML IJ SOLN: 4.0000 mg | Freq: Four times a day (QID) | INTRAMUSCULAR | Status: DC | PRN

## 2022-03-03 MED ORDER — METOPROLOL TARTRATE 12.5 MG HALF TABLET
12.5000 mg | ORAL_TABLET | Freq: Two times a day (BID) | ORAL | Status: DC
  Administered 2022-03-03 – 2022-03-04 (×3): 12.5 mg via ORAL
  Filled 2022-03-03 (×3): qty 1

## 2022-03-03 MED ORDER — IOHEXOL 350 MG/ML SOLN
INTRAVENOUS | Status: DC | PRN
  Administered 2022-03-03: 140 mL via INTRA_ARTERIAL

## 2022-03-03 MED ORDER — ASPIRIN 81 MG PO CHEW
81.0000 mg | CHEWABLE_TABLET | Freq: Every day | ORAL | Status: DC
  Administered 2022-03-03 – 2022-03-04 (×2): 81 mg via ORAL
  Filled 2022-03-03 (×2): qty 1

## 2022-03-03 MED ORDER — ATROPINE SULFATE 1 MG/10ML IJ SOSY
PREFILLED_SYRINGE | INTRAMUSCULAR | Status: DC | PRN
  Administered 2022-03-03: .5 mg via INTRAVENOUS

## 2022-03-03 MED ORDER — HEPARIN SODIUM (PORCINE) 1000 UNIT/ML IJ SOLN
INTRAMUSCULAR | Status: AC
  Filled 2022-03-03: qty 10

## 2022-03-03 MED ORDER — ATROPINE SULFATE 1 MG/10ML IJ SOSY
PREFILLED_SYRINGE | INTRAMUSCULAR | Status: AC
  Filled 2022-03-03: qty 10

## 2022-03-03 MED ORDER — SODIUM CHLORIDE 0.9 % IV SOLN: INTRAVENOUS | Status: AC

## 2022-03-03 MED ORDER — ACETAMINOPHEN 325 MG PO TABS: 650.0000 mg | ORAL_TABLET | ORAL | Status: DC | PRN

## 2022-03-03 MED ORDER — SODIUM CHLORIDE 0.9% FLUSH: 3.0000 mL | INTRAVENOUS | Status: DC | PRN

## 2022-03-03 MED ORDER — HEPARIN (PORCINE) IN NACL 1000-0.9 UT/500ML-% IV SOLN
INTRAVENOUS | Status: DC | PRN
  Administered 2022-03-03 (×2): 500 mL

## 2022-03-03 MED ORDER — VERAPAMIL HCL 2.5 MG/ML IV SOLN
INTRAVENOUS | Status: AC
  Filled 2022-03-03: qty 2

## 2022-03-03 MED ORDER — LIDOCAINE HCL (PF) 1 % IJ SOLN
INTRAMUSCULAR | Status: DC | PRN
  Administered 2022-03-03: 2 mL

## 2022-03-03 MED ORDER — NICOTINE 21 MG/24HR TD PT24
21.0000 mg | MEDICATED_PATCH | Freq: Every day | TRANSDERMAL | Status: DC
  Administered 2022-03-04: 21 mg via TRANSDERMAL
  Filled 2022-03-03: qty 1

## 2022-03-03 MED ORDER — NITROGLYCERIN 1 MG/10 ML FOR IR/CATH LAB
INTRA_ARTERIAL | Status: AC
Start: 2022-03-03 — End: ?
  Filled 2022-03-03: qty 10

## 2022-03-03 MED ORDER — VERAPAMIL HCL 2.5 MG/ML IV SOLN
INTRA_ARTERIAL | Status: DC | PRN
  Administered 2022-03-03: 5 mL via INTRA_ARTERIAL

## 2022-03-03 MED ORDER — LIDOCAINE HCL (PF) 1 % IJ SOLN
INTRAMUSCULAR | Status: AC
  Filled 2022-03-03: qty 30

## 2022-03-03 MED ORDER — SODIUM CHLORIDE 0.9 % IV SOLN: 250.0000 mL | INTRAVENOUS | Status: DC | PRN

## 2022-03-03 MED ORDER — TICAGRELOR 90 MG PO TABS
ORAL_TABLET | ORAL | Status: DC | PRN
  Administered 2022-03-03: 180 mg via ORAL

## 2022-03-03 MED ORDER — ICOSAPENT ETHYL 1 G PO CAPS
2.0000 g | ORAL_CAPSULE | Freq: Two times a day (BID) | ORAL | Status: DC
  Administered 2022-03-03 – 2022-03-04 (×2): 2 g via ORAL
  Filled 2022-03-03 (×3): qty 2

## 2022-03-03 MED ORDER — CHLORHEXIDINE GLUCONATE CLOTH 2 % EX PADS
6.0000 | MEDICATED_PAD | Freq: Every day | CUTANEOUS | Status: DC
  Administered 2022-03-03: 6 via TOPICAL

## 2022-03-03 MED ORDER — LABETALOL HCL 5 MG/ML IV SOLN: 10.0000 mg | INTRAVENOUS | Status: AC | PRN

## 2022-03-03 MED ORDER — TICAGRELOR 90 MG PO TABS
ORAL_TABLET | ORAL | Status: AC
  Filled 2022-03-03: qty 2

## 2022-03-03 SURGICAL SUPPLY — 19 items
BALL SAPPHIRE NC24 3.5X12 (BALLOONS) ×2
BALLN SAPPHIRE 2.0X12 (BALLOONS) ×2
BALLOON SAPPHIRE 2.0X12 (BALLOONS) IMPLANT
BALLOON SAPPHIRE NC24 3.5X12 (BALLOONS) IMPLANT
CATH OPTITORQUE TIG 4.0 5F (CATHETERS) ×1 IMPLANT
CATH VISTA GUIDE 6FR JR4 (CATHETERS) ×1 IMPLANT
DEVICE RAD COMP TR BAND LRG (VASCULAR PRODUCTS) ×1 IMPLANT
GLIDESHEATH SLEND A-KIT 6F 22G (SHEATH) ×1 IMPLANT
GUIDEWIRE INQWIRE 1.5J.035X260 (WIRE) IMPLANT
INQWIRE 1.5J .035X260CM (WIRE) ×2
KIT ENCORE 26 ADVANTAGE (KITS) ×2 IMPLANT
KIT HEART LEFT (KITS) ×2 IMPLANT
PACK CARDIAC CATHETERIZATION (CUSTOM PROCEDURE TRAY) ×2 IMPLANT
STENT SYNERGY XD 3.0X16 (Permanent Stent) IMPLANT
SYNERGY XD 3.0X16 (Permanent Stent) ×2 IMPLANT
TRANSDUCER W/STOPCOCK (MISCELLANEOUS) ×2 IMPLANT
TUBING CIL FLEX 10 FLL-RA (TUBING) ×2 IMPLANT
WIRE ASAHI PROWATER 180CM (WIRE) ×1 IMPLANT
WIRE HI TORQ VERSACORE-J 145CM (WIRE) ×1 IMPLANT

## 2022-03-03 NOTE — Progress Notes (Signed)
?  Echocardiogram ?2D Echocardiogram has been performed. ? ?Bruce Mcgee ?03/03/2022, 10:25 AM ?

## 2022-03-03 NOTE — Progress Notes (Signed)
[facility] received page for CODE STEMI; when [facility] arrived, pt. lying in bed in trauma bay of ED with wife and other family member at bedside.  Pt. says he had been feeling some chest pain which he attributed to indigestion since early this morning; pt. says he is in some pain but otherwise appeared relatively undistressed.  At length cardiologist and medical team took pt. to cath lab and [facility] escorted family to [facility] waiting room.  No further needs at this time.  Chaplains remain available. ? ?Bruce Mcgee, Chaplain ?Pager: [phone] ? ?

## 2022-03-03 NOTE — Progress Notes (Signed)
CARDIAC REHAB PHASE I  ? ?PRE:  Rate/Rhythm: 70 SR ? ?  BP: sitting 102/71 ? ?  SaO2: 96 RA ? ?MODE:  Ambulation: 370 ft  ? ?POST:  Rate/Rhythm: 100 ST ? ?  BP: sitting 107/70  ? ?  SaO2: 96 RA ? ?Tolerated well at quick pace. Second walk today. Feels well, VSS.  ? ?Discussed with pt MI, stent, restrictions, Brilinta importance, smoking cessation, diet, exercise, NTG and CRPII. Pt receptive. Will refer to [place]. He wants to quit smoking, is making plans. Resources given. ?[id number]  ? ?Yves Dill CES, ACSM ?03/03/2022 ?2:52 PM ? ? ? ? ?

## 2022-03-03 NOTE — ED Notes (Signed)
Cards at bedside

## 2022-03-03 NOTE — Progress Notes (Signed)
? ?Progress Note ? ?Patient Name: Bruce Mcgee ?Date of Encounter: 03/03/2022 ? ?[facility] Cardiologist: None  ? ?Subjective  ? ?Feels good this morning, no chest pain or dyspnea. ? ?Inpatient Medications  ?  ?Scheduled Meds: ? aspirin  324 mg Oral Once  ? aspirin  81 mg Oral Daily  ? atorvastatin  80 mg Oral Daily  ? Chlorhexidine Gluconate Cloth  6 each Topical Daily  ? levothyroxine  137 mcg Oral Daily  ? sodium chloride flush  3 mL Intravenous Q12H  ? ticagrelor  90 mg Oral BID  ? ?Continuous Infusions: ? sodium chloride 20 mL/hr at 03/02/22 2331  ? sodium chloride 75 mL/hr at 03/03/22 0600  ? sodium chloride    ? ?PRN Meds: ?sodium chloride, acetaminophen, albuterol, nitroGLYCERIN, ondansetron (ZOFRAN) IV, sodium chloride flush  ? ?Vital Signs  ?  ?Vitals:  ? 03/03/22 0557 03/03/22 0600 03/03/22 0601 03/03/22 0800  ?BP: 99/66 102/63 111/78   ?Pulse: 66 87 87   ?Resp: 17 (!) 21 (!) 23 20  ?Temp:    98.3 ?F (36.8 ?C)  ?TempSrc:    Oral  ?SpO2: 94% 95% 93%   ?Weight:      ?Height:      ? ? ?Intake/Output Summary (Last 24 hours) at 03/03/2022 0846 ?Last data filed at 03/03/2022 0600 ?Gross per 24 hour  ?Intake 429.01 ml  ?Output 400 ml  ?Net 29.01 ml  ? ? ?  03/03/2022  ?  1:25 AM 03/02/2022  ? 11:27 PM 09/19/2020  ?  7:27 PM  ?Last 3 Weights  ?Weight (lbs) 192 lb 14.4 oz 183 lb 180 lb  ?Weight (kg) 87.5 kg 83.008 kg 81.647 kg  ?   ? ?Telemetry  ?  ?Normal sinus rhythm with intermittent PVCs, no sustained arrhythmia- Personally Reviewed ? ?ECG  ?  ?Normal sinus rhythm 67 bpm, isoelectric ST segments now compared to ST elevation on previous tracing - Personally Reviewed ? ?Physical Exam  ?Alert, oriented, NAD ?GEN: No acute distress.   ?Neck: No JVD ?Cardiac: RRR, no murmurs, rubs, or gallops.  ?Respiratory: Clear to auscultation bilaterally. ?GI: Soft, nontender, non-distended  ?MS: No edema; No deformity.  Right radial cath site clear ?Neuro:  Nonfocal  ?Psych: Normal affect  ? ?Labs  ?  ?High Sensitivity  Troponin:   ?Recent Labs  ?Lab 03/02/22 ?2326 03/03/22 ?0248  ?TROPONINIHS 153* >24,000*  ?   ?Chemistry ?Recent Labs  ?Lab 03/02/22 ?2326 03/03/22 ?0248  ?NA 137 138  ?K 3.5 4.0  ?CL 105 107  ?CO2 24 23  ?GLUCOSE 137* 136*  ?BUN 15 14  ?CREATININE 0.97 0.96  ?CALCIUM 8.3* 8.4*  ?PROT 6.6  --   ?ALBUMIN 3.3*  --   ?AST 20  --   ?ALT 30  --   ?ALKPHOS 91  --   ?BILITOT 0.6  --   ?GFRNONAA >60 >60  ?ANIONGAP 8 8  ?  ?Lipids  ?Recent Labs  ?Lab 03/02/22 ?2326  ?CHOL 197  ?TRIG 431*  ?HDL 25*  ?LDLCALC UNABLE TO CALCULATE IF TRIGLYCERIDE OVER 400 mg/dL  ?CHOLHDL 7.9  ?  ?Hematology ?Recent Labs  ?Lab 03/02/22 ?2326 03/03/22 ?0248  ?WBC 16.0* 18.4*  ?RBC 5.25 4.99  ?HGB 15.4 14.8  ?HCT 47.4 43.8  ?MCV 90.3 87.8  ?MCH 29.3 29.7  ?MCHC 32.5 33.8  ?RDW 13.6 13.7  ?PLT 306 280  ? ?Thyroid No results for input(s): TSH, FREET4 in the last 168 hours.  ?BNPNo results for input(s):  BNP, PROBNP in the last 168 hours.  ?DDimer No results for input(s): DDIMER in the last 168 hours.  ? ?Radiology  ?  ?CARDIAC CATHETERIZATION ? ?Result Date: 03/03/2022 ?Images from the original result were not included.   Mid RCA lesion is 100% stenosed.   1st Mrg lesion is 90% stenosed.   2nd Mrg lesion is 90% stenosed.   Mid Cx to Dist Cx lesion is 100% stenosed.   A drug-eluting stent was successfully placed using a SYNERGY XD 3.0X16.   Post intervention, there is a 0% residual stenosis.   There is moderate left ventricular systolic dysfunction.   LV end diastolic pressure is moderately elevated.   The left ventricular ejection fraction is 35-45% by visual estimate. Bruce Mcgee is a 66 y.o. male  [id number] LOCATION:  FACILITY: [facility] PHYSICIAN: Quay Burow, M.D. [birth year] DATE OF PROCEDURE:  03/03/2022 DATE OF DISCHARGE: CARDIAC CATHETERIZATION / RCA PCI/DES History obtained from chart review.  66 year old married Caucasian male without prior cardiac history.  He does have a history of tobacco abuse.  He developed chest pain earlier this morning  which has waxed and waned all day becoming sustained earlier this evening.  He was brought to the ER by EMS where his EKG showed inferior ST segment elevation with reciprocal anterior depression.  He was brought to the Cath Lab urgently for angiography and intervention. PROCEDURE DESCRIPTION: The patient was brought to the second floor [facility]iac cath lab in the postabsorptive state. He was not premedicated. His right wrist was prepped and shaved in usual sterile fashion. Xylocaine 1% was used for local anesthesia. A 6 French sheath was inserted into the right radial artery using standard Seldinger technique. The patient received 13,500 units  of heparin intravenously.  A 5 [place] TIG catheter was used for selective coronary angiography.  A 6 [place] guide catheter was used for RCA intervention, left ventriculography with pullback.  Isovue dye is used for the entirety of the case (140 cc of contrast total to patient).  Retrograde aortic, left ventricular and pullback pressures were recorded.  Radial cocktail was administered via the SideArm sheath. Patient received 180 mg of p.o. Brilinta.  Isovue dye is used for the entirety of the intervention.  I was able to cross the occluded mid RCA with a 0.14 [place] guidewire establishing antegrade flow.  I dilated the stenosis with a 2 mm x 12 mm balloon.  The door to balloon time was 26 minutes.  I then performed PCI drug-eluting stenting with a 3 mm x 16 mm long Synergy drug-eluting stent postdilated with a 3.5 mm x 12 mm balloon at 12 atm resulting reduction with total occlusion to 0% residual with excellent flow.  The patient became transiently bradycardic requiring 0.5 mg of IV atropine.  ? ?Successful PCI and drug-eluting stenting of a large dominant mid RCA occlusion with a door to balloon time of 26 minutes.  His EF is in the 35% range with inferior akinesia.  He will need guideline directed optimal medical therapy including beta-blocker, ACE inhibitor,  high-dose statin drug and uninterrupted DAPT for at least 12 months.  The sheath was removed and a TR band was placed on the right wrist to achieve patent hemostasis.  The patient left lab in stable condition. Quay Burow. MD, [facility] 03/03/2022 1:22 AM  ? ?DG Chest Port 1 View ? ?Result Date: 03/02/2022 ?CLINICAL DATA:  Chest pain, shortness of breath EXAM: PORTABLE CHEST 1 VIEW COMPARISON:  12/19/2016 FINDINGS: Lungs  are clear.  No pleural effusion or pneumothorax. The heart is normal in size. IMPRESSION: No evidence of acute cardiopulmonary disease. Electronically Signed   By: Julian Hy M.D.   On: 03/02/2022 23:43   ? ?Cardiac Studies  ? ?Cardiac catheterization films reviewed. ? ?Patient Profile  ?   ?66 y.o. male with history of heavy tobacco abuse presents with acute inferior STEMI 03/02/2022 ? ?Assessment & Plan  ?  ?1.  Acute inferior STEMI: Cardiac cath demonstrates large, dominant RCA with total occlusion in the mid vessel.  Patient underwent PCI with an excellent result.  He is now chest pain-free having a very good early recovery from his STEMI.  Continue aspirin, ticagrelor, and high intensity statin drug.  Patient educated about the importance of adherence to dual antiplatelet therapy.  Add low-dose metoprolol 12.5 mg twice daily today.  Start with low-dose in the setting of borderline blood pressure readings.  Await 2D echo result.  Ideally we will get him on ACE/ARB if he can tolerate. ?2.  Tobacco abuse: Patient is a heavy smoker 2-1/2 to 3 packs/day.  Importance of tobacco cessation reviewed with the patient, counseling done.  We will place nicotine patch. ?3.  Hypothyroidism: We will add TSH to his next lab draw.  Chronic issue. ?4.  Hyperlipidemia: Triglycerides 431.  Total cholesterol 197.  Patient started on high intensity statin drug.  We will also start on Vascepa. ? ?Disposition: Phase 1 cardiac rehab today, 2D echo today, add metoprolol, okay to ambulate, anticipate discharge  tomorrow. ? ?For questions or updates, please contact [facility] ?Please consult [email or web address] for contact info under  ? ?  ?   ?Signed, ?Sherren Mocha, MD  ?03/03/2022, 8:46 AM   ? ?

## 2022-03-03 NOTE — TOC Benefit Eligibility Note (Signed)
Patient Advocate Encounter ? ?Insurance verification completed.   ? ?The patient is currently admitted and upon discharge could be taking Brilinta 90 mg. ? ?The current 30 day co-pay is, $50.00.  ? ?The patient is currently admitted and upon discharge could be taking icosapent Ethyl (Vascepa) 1 g. ? ?Requires Prior Authorization ? ?The patient is insured through [employer]  ? ? ? ?Roland Earl, CPhT ?Pharmacy Patient Advocate Specialist ?[facility] Pharm[facility] Patient Advocate Team ?Direct Number: [phone]  Fax: [phone] ? ? ? ? ? ?  ?

## 2022-03-03 NOTE — TOC Benefit Eligibility Note (Signed)
Patient Advocate Encounter ? ?Prior Authorization for icosapent Ethyl (Vascepa) 1 g has been approved.   ? ?PA# [id number] ?Effective dates: 03/03/2022 through 03/03/2023 ? ?Patients co-pay is $50.00.  ? ? ? ?Roland Earl, CPhT ?Pharmacy Patient Advocate Specialist ?[facility] Pharm[facility] Patient Advocate Team ?Direct Number: [phone]  Fax: [phone]  ?

## 2022-03-03 NOTE — Progress Notes (Signed)
Latest Reference Range & Units 03/03/22 02:48  ?Troponin I (High Sensitivity) <18 ng/L >24,000 (HH)  ?(HH): Data is critically high ? ?Patient is s/p LHC with DES to RCA. Troponin I expected to be high. Will continue to trend per MD orders.  ?

## 2022-03-04 ENCOUNTER — Other Ambulatory Visit (HOSPITAL_COMMUNITY): Payer: Self-pay

## 2022-03-04 DIAGNOSIS — E782 Mixed hyperlipidemia: Secondary | ICD-10-CM

## 2022-03-04 DIAGNOSIS — Z720 Tobacco use: Secondary | ICD-10-CM

## 2022-03-04 DIAGNOSIS — E039 Hypothyroidism, unspecified: Secondary | ICD-10-CM

## 2022-03-04 LAB — HIV ANTIBODY (ROUTINE TESTING W REFLEX): HIV Screen 4th Generation wRfx: NONREACTIVE

## 2022-03-04 MED ORDER — ASPIRIN 81 MG PO CHEW
81.0000 mg | CHEWABLE_TABLET | Freq: Every day | ORAL | 3 refills | Status: AC
  Filled 2022-03-04: qty 90, 90d supply, fill #0

## 2022-03-04 MED ORDER — ATORVASTATIN CALCIUM 80 MG PO TABS
80.0000 mg | ORAL_TABLET | Freq: Every day | ORAL | 3 refills | Status: DC
  Filled 2022-03-04: qty 90, 90d supply, fill #0

## 2022-03-04 MED ORDER — NICOTINE 21 MG/24HR TD PT24
21.0000 mg | MEDICATED_PATCH | Freq: Every day | TRANSDERMAL | 0 refills | Status: DC
  Filled 2022-03-04: qty 28, 28d supply, fill #0

## 2022-03-04 MED ORDER — METOPROLOL TARTRATE 25 MG PO TABS
12.5000 mg | ORAL_TABLET | Freq: Two times a day (BID) | ORAL | 3 refills | Status: DC
  Filled 2022-03-04: qty 90, 90d supply, fill #0

## 2022-03-04 MED ORDER — TICAGRELOR 90 MG PO TABS
90.0000 mg | ORAL_TABLET | Freq: Two times a day (BID) | ORAL | 3 refills | Status: DC
  Filled 2022-03-04: qty 180, 90d supply, fill #0

## 2022-03-04 MED ORDER — NITROGLYCERIN 0.4 MG SL SUBL
0.4000 mg | SUBLINGUAL_TABLET | SUBLINGUAL | 12 refills | Status: DC | PRN
  Filled 2022-03-04: qty 25, 14d supply, fill #0

## 2022-03-04 MED ORDER — ICOSAPENT ETHYL 1 G PO CAPS
2.0000 g | ORAL_CAPSULE | Freq: Two times a day (BID) | ORAL | 3 refills | Status: DC
  Filled 2022-03-04: qty 360, 90d supply, fill #0

## 2022-03-04 NOTE — Progress Notes (Signed)
? ?Progress Note ? ?Patient Name: Bruce Mcgee ?Date of Encounter: 03/04/2022 ? ?[facility] Cardiologist: None  ? ?Subjective  ? ?No Chest pain or dyspnea. Did well with cardiac rehab yesterday.  ? ?Inpatient Medications  ?  ?Scheduled Meds: ? aspirin  324 mg Oral Once  ? aspirin  81 mg Oral Daily  ? atorvastatin  80 mg Oral Daily  ? Chlorhexidine Gluconate Cloth  6 each Topical Daily  ? icosapent Ethyl  2 g Oral BID  ? levothyroxine  137 mcg Oral Daily  ? metoprolol tartrate  12.5 mg Oral BID  ? nicotine  21 mg Transdermal Daily  ? sodium chloride flush  3 mL Intravenous Q12H  ? ticagrelor  90 mg Oral BID  ? ?Continuous Infusions: ? sodium chloride 20 mL/hr at 03/02/22 2331  ? sodium chloride    ? ?PRN Meds: ?sodium chloride, acetaminophen, albuterol, nitroGLYCERIN, ondansetron (ZOFRAN) IV, sodium chloride flush  ? ?Vital Signs  ?  ?Vitals:  ? 03/04/22 0600 03/04/22 0700 03/04/22 0735 03/04/22 0800  ?BP: 113/73 119/74  96/60  ?Pulse: 65 94  69  ?Resp: 18 20  (!) 21  ?Temp:   98 ?F (36.7 ?C)   ?TempSrc:   Oral   ?SpO2: 92% 92%  92%  ?Weight:      ?Height:      ? ? ?Intake/Output Summary (Last 24 hours) at 03/04/2022 0938 ?Last data filed at 03/04/2022 0800 ?Gross per 24 hour  ?Intake 1331.3 ml  ?Output 1275 ml  ?Net 56.3 ml  ? ? ?  03/03/2022  ?  1:25 AM 03/02/2022  ? 11:27 PM 09/19/2020  ?  7:27 PM  ?Last 3 Weights  ?Weight (lbs) 192 lb 14.4 oz 183 lb 180 lb  ?Weight (kg) 87.5 kg 83.008 kg 81.647 kg  ?   ? ?Telemetry  ?  ?Sinus rhythm with PVC's - Personally Reviewed ? ? ?Physical Exam  ?Alert, oriented, in NAD ?GEN: No acute distress.   ?Neck: No JVD ?Cardiac: RRR, no murmurs, rubs, or gallops.  ?Respiratory: Clear to auscultation bilaterally. ?GI: Soft, nontender, non-distended  ?MS: No edema; No deformity. ?Neuro:  Nonfocal  ?Psych: Normal affect  ? ?Labs  ?  ?High Sensitivity Troponin:   ?Recent Labs  ?Lab 03/02/22 ?2326 03/03/22 ?0248  ?TROPONINIHS 153* >24,000*  ?   ?Chemistry ?Recent Labs  ?Lab  03/02/22 ?2326 03/03/22 ?0248  ?NA 137 138  ?K 3.5 4.0  ?CL 105 107  ?CO2 24 23  ?GLUCOSE 137* 136*  ?BUN 15 14  ?CREATININE 0.97 0.96  ?CALCIUM 8.3* 8.4*  ?PROT 6.6  --   ?ALBUMIN 3.3*  --   ?AST 20  --   ?ALT 30  --   ?ALKPHOS 91  --   ?BILITOT 0.6  --   ?GFRNONAA >60 >60  ?ANIONGAP 8 8  ?  ?Lipids  ?Recent Labs  ?Lab 03/02/22 ?2326  ?CHOL 197  ?TRIG 431*  ?HDL 25*  ?LDLCALC UNABLE TO CALCULATE IF TRIGLYCERIDE OVER 400 mg/dL  ?CHOLHDL 7.9  ?  ?Hematology ?Recent Labs  ?Lab 03/02/22 ?2326 03/03/22 ?0248  ?WBC 16.0* 18.4*  ?RBC 5.25 4.99  ?HGB 15.4 14.8  ?HCT 47.4 43.8  ?MCV 90.3 87.8  ?MCH 29.3 29.7  ?MCHC 32.5 33.8  ?RDW 13.6 13.7  ?PLT 306 280  ? ?Thyroid No results for input(s): TSH, FREET4 in the last 168 hours.  ?BNPNo results for input(s): BNP, PROBNP in the last 168 hours.  ?DDimer No results for input(s):  DDIMER in the last 168 hours.  ? ?Radiology  ?  ?CARDIAC CATHETERIZATION ? ?Result Date: 03/03/2022 ?Images from the original result were not included.   Mid RCA lesion is 100% stenosed.   1st Mrg lesion is 90% stenosed.   2nd Mrg lesion is 90% stenosed.   Mid Cx to Dist Cx lesion is 100% stenosed.   A drug-eluting stent was successfully placed using a SYNERGY XD 3.0X16.   Post intervention, there is a 0% residual stenosis.   There is moderate left ventricular systolic dysfunction.   LV end diastolic pressure is moderately elevated.   The left ventricular ejection fraction is 35-45% by visual estimate. Bruce Mcgee is a 66 y.o. male  [id number] LOCATION:  FACILITY: [facility] PHYSICIAN: Quay Burow, M.D. Aug 11, 1956 DATE OF PROCEDURE:  03/03/2022 DATE OF DISCHARGE: CARDIAC CATHETERIZATION / RCA PCI/DES History obtained from chart review.  66 year old married Caucasian male without prior cardiac history.  He does have a history of tobacco abuse.  He developed chest pain earlier this morning which has waxed and waned all day becoming sustained earlier this evening.  He was brought to the ER by EMS where his EKG  showed inferior ST segment elevation with reciprocal anterior depression.  He was brought to the Cath Lab urgently for angiography and intervention. PROCEDURE DESCRIPTION: The patient was brought to the second floor [facility] cath lab in the postabsorptive state. He was not premedicated. His right wrist was prepped and shaved in usual sterile fashion. Xylocaine 1% was used for local anesthesia. A 6 French sheath was inserted into the right radial artery using standard Seldinger technique. The patient received 13,500 units  of heparin intravenously.  A 5 [place] TIG catheter was used for selective coronary angiography.  A 6 [place] guide catheter was used for RCA intervention, left ventriculography with pullback.  Isovue dye is used for the entirety of the case (140 cc of contrast total to patient).  Retrograde aortic, left ventricular and pullback pressures were recorded.  Radial cocktail was administered via the SideArm sheath. Patient received 180 mg of p.o. Brilinta.  Isovue dye is used for the entirety of the intervention.  I was able to cross the occluded mid RCA with a 0.14 [place] guidewire establishing antegrade flow.  I dilated the stenosis with a 2 mm x 12 mm balloon.  The door to balloon time was 26 minutes.  I then performed PCI drug-eluting stenting with a 3 mm x 16 mm long Synergy drug-eluting stent postdilated with a 3.5 mm x 12 mm balloon at 12 atm resulting reduction with total occlusion to 0% residual with excellent flow.  The patient became transiently bradycardic requiring 0.5 mg of IV atropine.  ? ?Successful PCI and drug-eluting stenting of a large dominant mid RCA occlusion with a door to balloon time of 26 minutes.  His EF is in the 35% range with inferior akinesia.  He will need guideline directed optimal medical therapy including beta-blocker, ACE inhibitor, high-dose statin drug and uninterrupted DAPT for at least 12 months.  The sheath was removed and a TR band was placed on  the right wrist to achieve patent hemostasis.  The patient left lab in stable condition. Quay Burow. MD, [facility] 03/03/2022 1:22 AM  ? ?DG Chest Port 1 View ? ?Result Date: 03/02/2022 ?CLINICAL DATA:  Chest pain, shortness of breath EXAM: PORTABLE CHEST 1 VIEW COMPARISON:  12/19/2016 FINDINGS: Lungs are clear.  No pleural effusion or pneumothorax. The heart is normal in  size. IMPRESSION: No evidence of acute cardiopulmonary disease. Electronically Signed   By: Julian Hy M.D.   On: 03/02/2022 23:43  ? ?ECHOCARDIOGRAM COMPLETE ? ?Result Date: 03/03/2022 ?   ECHOCARDIOGRAM REPORT   Patient Name:   Bruce Mcgee Date of Exam: 03/03/2022 Medical Rec #:  [id number]       Height:       69.0 in Accession #:    [id number]      Weight:       192.9 lb Date of Birth:  [birth year]       BSA:          2.035 m? Patient Age:    66 years        BP:           100/69 mmHg Patient Gender: M               HR:           83 bpm. Exam Location:  Inpatient Procedure: 2D Echo, Cardiac Doppler and Color Doppler Indications:    Acute myocardial infarction, unspecified I21.9  History:        Patient has no prior history of Echocardiogram examinations.                 Acute MI.  Sonographer:    Bernadene Person RDCS Referring Phys: [place]  1. Left ventricular ejection fraction, by estimation, is 55 to 60%. The left ventricle has normal function. The left ventricle has no regional wall motion abnormalities. Left ventricular diastolic parameters are indeterminate. Elevated left ventricular end-diastolic pressure. There is akinesis of the left ventricular, entire inferior wall.  2. Right ventricular systolic function is normal. The right ventricular size is normal. Tricuspid regurgitation signal is inadequate for assessing PA pressure.  3. The mitral valve is normal in structure. Trivial mitral valve regurgitation. No evidence of mitral stenosis.  4. The aortic valve is tricuspid. Aortic valve regurgitation is not  visualized. Aortic valve sclerosis/calcification is present, without any evidence of aortic stenosis.  5. Aortic dilatation noted. There is borderline dilatation of the ascending aorta, measuring 37 mm.  6. The inferi

## 2022-03-04 NOTE — Discharge Summary (Signed)
?Discharge Summary  ?  ?Patient ID: Bruce Mcgee ?MRN: [id number]; DOB: 02-28-1956 ? ?Admit date: 03/02/2022 ?Discharge date: 03/04/2022 ? ?PCP:  Aletha Halim., PA-C ?  ?[facility] Providers ?Cardiologist:  Quay Burow, MD   ? ? ?Discharge Diagnoses  ?  ?Principal Problem: ?  Acute ST elevation myocardial infarction (STEMI) of inferior wall (HCC) ?Active Problems: ?  Acute ST elevation myocardial infarction (STEMI) due to occlusion of right coronary artery ([place]) ?  Hypothyroidism ?  Tobacco abuse ?  Mixed hyperlipidemia ? ? ? ?Diagnostic Studies/Procedures  ?  ?Left Heart Cath 03/03/22  ?  Mid RCA lesion is 100% stenosed. ?  1st Mrg lesion is 90% stenosed. ?  2nd Mrg lesion is 90% stenosed. ?  Mid Cx to Dist Cx lesion is 100% stenosed. ?  A drug-eluting stent was successfully placed using a SYNERGY XD 3.0X16. ?  Post intervention, there is a 0% residual stenosis. ?  There is moderate left ventricular systolic dysfunction. ?  LV end diastolic pressure is moderately elevated. ?  The left ventricular ejection fraction is 35-45% by visual estimate. ? ?IMPRESSION: Successful PCI and drug-eluting stenting of a large dominant mid RCA occlusion with a door to balloon time of 26 minutes.  His EF is in the 35% range with inferior akinesia.  He will need guideline directed optimal medical therapy including beta-blocker, ACE inhibitor, high-dose statin drug and uninterrupted DAPT for at least 12 months.  The sheath was removed and a TR band was placed on the right wrist to achieve patent hemostasis.  The patient left lab in stable condition. ?Diagnostic ?Dominance: Right ?Intervention ? ? ? ? ?Echocardiogram 03/03/22  ? 1. Left ventricular ejection fraction, by estimation, is 55 to 60%. The  ?left ventricle has normal function. The left ventricle has no regional  ?wall motion abnormalities. Left ventricular diastolic parameters are  ?indeterminate. Elevated left ventricular  ?end-diastolic pressure. There is akinesis  of the left ventricular, entire  ?inferior wall.  ? 2. Right ventricular systolic function is normal. The right ventricular  ?size is normal. Tricuspid regurgitation signal is inadequate for assessing  ?PA pressure.  ? 3. The mitral valve is normal in structure. Trivial mitral valve  ?regurgitation. No evidence of mitral stenosis.  ? 4. The aortic valve is tricuspid. Aortic valve regurgitation is not  ?visualized. Aortic valve sclerosis/calcification is present, without any  ?evidence of aortic stenosis.  ? 5. Aortic dilatation noted. There is borderline dilatation of the  ?ascending aorta, measuring 37 mm.  ? 6. The inferior vena cava is normal in size with greater than 50%  ?respiratory variability, suggesting right atrial pressure of 3 mmHg.  ? ?_____________ ?  ?History of Present Illness   ?  ?Bruce Mcgee is a 66 y.o. male with history of heavy tobacco abuse presented with acute inferior STEMI 03/02/2022. ? ?At the time of initial cardiology evaluation, patient reported having chest pain that started at 6 AM that morning. Initially, pain felt like heaviness in his chest and acid reflux, so patient took prilosec in the morning with improvement. Did not have any further episodes of chest pain throughout the day. Again, developed chest pain at 10 PM that night. Pain was severe in intensity, non radiating. Patient took full dose of aspirin at home and was brought to the ED by EMS. Received IV morphine in the ER with decrease intensity of pain to 4/10.  ? ?EKG showed acute inferior STEMI (ST elevations in inferior leads) and  ST depression in V2. ? ? ?Hospital Course  ?   ?Consultants: None ? ?Acute inferior STEMI: Cath findings as above, with total occlusion of the large dominant RCA, treated with primary PCI.  Plan medical therapy for residual CAD.  Patient is treated with aspirin, ticagrelor, high intensity statin drug, and low-dose metoprolol.  His blood pressures are reviewed and unfortunately he will not  tolerate up titration of metoprolol or addition of an ACE/ARB at this time.  These can be added in the outpatient setting.  Patient appears to be medically stable for discharge today. ?Tobacco abuse: Cessation counseling done.  Patient is a very heavy smoker of 2-1/2 to 3 packs/day.  Nicotine patch has been placed. ?Hypothyroidism: Has been followed closely as an outpatient, last TSH in November 2022 was 0.63.  Continue outpatient follow-up. ?Mixed hyperlipidemia: Triglycerides were 431.  Patient started on atorvastatin 80 mg.  Vascepa added yesterday.  Should continue on both. Will need LFTs and Lipid panel in 2-3 months  ? ?Patient seen and examined by Dr. Burt Knack and deemed stable for discharge  ? ?Patient has a follow up appointment on 03/26/22. ? ?Did the patient have an acute coronary syndrome (MI, NSTEMI, STEMI, etc) this admission?:  Yes                              ? ?AHA/ACC Clinical Performance & Quality Measures: ?Aspirin prescribed? - Yes ?ADP Receptor Inhibitor (Plavix/Clopidogrel, Brilinta/Ticagrelor or Effient/Prasugrel) prescribed (includes medically managed patients)? - Yes ?Beta Blocker prescribed? - Yes ?High Intensity Statin (Lipitor 40-80mg  or Crestor 20-40mg ) prescribed? - Yes ?EF assessed during THIS hospitalization? - Yes ?For EF <40%, was ACEI/ARB prescribed? - Not Applicable (EF >/= [id number]) ?For EF <40%, Aldosterone Antagonist (Spironolactone or Eplerenone) prescribed? - Not Applicable (EF >/= [id number]) ?Cardiac Rehab Phase II ordered (including medically managed patients)? - Yes  ? ?   ? ?  ?_____________ ? ?Discharge Vitals ?Blood pressure 110/70, pulse 85, temperature 98.5 ?F (36.9 ?C), temperature source Oral, resp. rate (!) 28, height 5\' 9"  (1.753 m), weight 87.5 kg, SpO2 93 %.  ?Filed Weights  ? 03/02/22 2327 03/03/22 0125  ?Weight: 83 kg 87.5 kg  ? ? ?Labs & Radiologic Studies  ?  ?CBC ?Recent Labs  ?  03/02/22 ?2326 03/03/22 ?0248  ?WBC 16.0* 18.4*  ?NEUTROABS 7.9*  --   ?HGB 15.4 14.8  ?HCT  47.4 43.8  ?MCV 90.3 87.8  ?PLT 306 280  ? ?Basic Metabolic Panel ?Recent Labs  ?  03/02/22 ?2326 03/03/22 ?0248  ?NA 137 138  ?K 3.5 4.0  ?CL 105 107  ?CO2 24 23  ?GLUCOSE 137* 136*  ?BUN 15 14  ?CREATININE 0.97 0.96  ?CALCIUM 8.3* 8.4*  ? ?Liver Function Tests ?Recent Labs  ?  03/02/22 ?2326  ?AST 20  ?ALT 30  ?ALKPHOS 91  ?BILITOT 0.6  ?PROT 6.6  ?ALBUMIN 3.3*  ? ?No results for input(s): LIPASE, AMYLASE in the last 72 hours. ?High Sensitivity Troponin:   ?Recent Labs  ?Lab 03/02/22 ?2326 03/03/22 ?0248  ?TROPONINIHS 153* >24,000*  ?  ?BNP ?Invalid input(s): POCBNP ?D-Dimer ?No results for input(s): DDIMER in the last 72 hours. ?Hemoglobin A1C ?Recent Labs  ?  03/02/22 ?2326  ?HGBA1C 6.0*  ? ?Fasting Lipid Panel ?Recent Labs  ?  03/02/22 ?2326  ?CHOL 197  ?HDL 25*  ?LDLCALC UNABLE TO CALCULATE IF TRIGLYCERIDE OVER 400 mg/dL  ?TRIG 431*  ?CHOLHDL 7.9  ?  LDLDIRECT 103.0*  ? ?Thyroid Function Tests ?No results for input(s): TSH, T4TOTAL, T3FREE, THYROIDAB in the last 72 hours. ? ?Invalid input(s): FREET3 ?_____________  ?CARDIAC CATHETERIZATION ? ?Result Date: 03/03/2022 ?Images from the original result were not included.   Mid RCA lesion is 100% stenosed.   1st Mrg lesion is 90% stenosed.   2nd Mrg lesion is 90% stenosed.   Mid Cx to Dist Cx lesion is 100% stenosed.   A drug-eluting stent was successfully placed using a SYNERGY XD 3.0X16.   Post intervention, there is a 0% residual stenosis.   There is moderate left ventricular systolic dysfunction.   LV end diastolic pressure is moderately elevated.   The left ventricular ejection fraction is 35-45% by visual estimate. KELSON GARBO is a 66 y.o. male  [id number] LOCATION:  FACILITY: [facility] PHYSICIAN: Quay Burow, M.D. Mar 26, 1956 DATE OF PROCEDURE:  03/03/2022 DATE OF DISCHARGE: CARDIAC CATHETERIZATION / RCA PCI/DES History obtained from chart review.  66 year old married Caucasian male without prior cardiac history.  He does have a history of tobacco abuse.  He  developed chest pain earlier this morning which has waxed and waned all day becoming sustained earlier this evening.  He was brought to the ER by EMS where his EKG showed inferior ST segment elevation with

## 2022-03-04 NOTE — Progress Notes (Signed)
Discharge teaching completed with patient and spouse. All questions answered. VSS at departure. Patient discharged with all personal belongings via wheelchair by volunteer.  ?

## 2022-03-04 NOTE — Progress Notes (Signed)
CARDIAC REHAB PHASE I  ? ?PRE:  Rate/Rhythm: 70 SR ? ?  BP: sitting 110/70 ? ?  SaO2: 93 RA ? ?MODE:  Ambulation: 370 ft  ? ?POST:  Rate/Rhythm: 95 SR ? ?  BP: sitting 112/67  ? ?  SaO2:  ? ?Tolerated well, no c/o. Reviewed ed, no further questions. Encouraged wife to think about smoking cessation as well. ?[phone]  ? ?Ethelda Chick CES, ACSM ?03/04/2022 ?10:51 AM ? ? ? ? ?

## 2022-03-07 ENCOUNTER — Other Ambulatory Visit (HOSPITAL_COMMUNITY): Payer: Self-pay

## 2022-03-18 ENCOUNTER — Other Ambulatory Visit (HOSPITAL_COMMUNITY): Payer: Self-pay

## 2022-03-18 ENCOUNTER — Telehealth (HOSPITAL_COMMUNITY): Payer: Self-pay

## 2022-03-18 NOTE — Telephone Encounter (Signed)
Pharmacy Transitions of Care Follow-up Telephone Call ? ?Date of discharge: 03/04/22  ?Discharge Diagnosis: STEMI ? ?How have you been since you were released from the hospital? Getting better every day. Patient has stopped smoking and says it is going very well.  ? ?Medication changes made at discharge: ?START taking: ?aspirin  ?atorvastatin (LIPITOR)  ?icosapent Ethyl (VASCEPA)  ?metoprolol tartrate (LOPRESSOR)  ?nicotine (NICODERM CQ - dosed in mg/24 hours)  ?nitroGLYCERIN (NITROSTA)  ?ticagrelor (BRILINTA)  ? ?Medication changes verified by the patient? Yes ? ?Medication Accessibility: ? ?Home Pharmacy: [facility]  ? ?Was the patient provided with refills on discharged medications? Yes  ? ?Have all prescriptions been transferred from [facility] to home pharmacy? Yes  ? ?Is the patient able to afford medications? Yes ?Notable copays: $5/90 day supply ? ?Medication Review: ? ?TICAGRELOR (BRILINTA) ?Ticagrelor 90 mg BID. ?- Educated patient on dual antiplatelet therapy with aspirin and ticagrelor. ?- Discussed importance of taking medication around the same time every day, ?- Reviewed potential DDIs with patient ?- Advised patient of medications to avoid (NSAIDs, aspirin maintenance doses>100 mg daily) ?- Educated that Tylenol (acetaminophen) will be the preferred analgesic to prevent risk of bleeding  ?- Emphasized importance of monitoring for signs and symptoms of bleeding (abnormal bruising, prolonged bleeding, nose bleeds, bleeding from gums, discolored urine, black tarry stools)  ?- Educated patient to notify doctor if shortness of breath or abnormal heartbeat occur ?- Advised patient to alert all providers of antiplatelet therapy prior to starting a new medication or having a procedure  ? ?Follow-up Appointments: ? ?PCP Hospital f/u appt confirmed? Scheduled to see Darreld Mclean, PA-C on 03/26/22 @ 3:35 pm.  ? ?If their condition worsens, is the pt aware to call PCP or go to the Emergency Dept.?  Yes ? ?Final Patient Assessment: ?-Pt is doing well.  ?-Pt verbalized understanding of Brilanta.  ?-Pt has post discharge appointment and refill sent to [facility]. ?

## 2022-03-20 NOTE — Progress Notes (Deleted)
**Note Bruce-Identified via Obfuscation** ?Cardiology Office Note:   ? ?Date:  03/20/2022  ? ?ID:  Bruce Mcgee, DOB 1955-12-28, MRN [id number] ? ?PCP:  Bruce Campbell., PA-C  ?Cardiologist:  Bruce Batty, MD  ?Electrophysiologist:  None  ? ?Referring MD: Bruce Campbell., PA-C  ? ?Chief Complaint: hospital follow-up of STEMI ? ?History of Present Illness:   ? ?Bruce Mcgee is a 66 y.o. male with a history of CAD with recent STEMI on 03/03/2022 s/p DES to RCA, hyperlipidemia, hypothyroidism, and tobacco abuse who is followed by Dr. Allyson Mcgee and presents today for hospital follow-up after recent STEMI. ? ?Patient was first seen by Cardiology during recent hospitalization. He was admitted from 03/02/2022 to 03/04/2022 for an acute STEMI after presenting with chest pain. High-sensitivity troponin peaked at >24,000. Emergent cardiac catheterization showed 100% stenosis of mid RCA, 100% stenosis of mid to distal LCX, 90% stenosis of OM1, and 90% stenosis of OM2. He underwent successful PCI with DES to RCA lesion. Remainder of disease was treated medically. LVEF 35-45% on cath. However, Echo showed LVEF of 55-60% with akinesis of the entire inferior wall. He was started on DAPT with Aspirin and Brilinta as well as a beta-blocker and high-intensity statin. He was not started on ACEi/ARB due to soft BP. ? ?Patient presents today for follow-up. *** ? ?CAD with Recent STEMI ?Recently admitted with acute STEMI. LHC showed 100% stenosis of mid RCA, 100% stenosis of mid to distal LCX, 90% stenosis of OM1, and 90% stenosis of OM2. He underwent successful PCI with DES to RCA lesion. Remainder of disease was treated medically. Echo showed LVEF of 55-60% with akinesis of the entire inferior wall. ?- No recurrent chest pain.  ?- Continue DAPT with Aspirin and Brilinta. ?- Continue Lopressor 12.5mg  twice daily. ?- Continue Lipitor 80mg  daily. ? ?Hyperlipidemia ?Lipid panel during admission: Total Cholesterol 197, Triglycerides 431, HDL 25. Direct LDL 103.  ?- Started on  Lipitor 80mg  daily and Vascepa 2g twice daily during recent admission. Continue. ?- Will repeat lipid panel and LFTs in 6-8 weeks. *** ? ?Tobacco Abuse ?*** ? ?Past Medical History:  ?Diagnosis Date  ? 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 Headache   ? History of kidney stones   ? Hypothyroidism   ? ? ?Past Surgical History:  ?Procedure Laterality Date  ? APPENDECTOMY    ? CORONARY/GRAFT ACUTE MI REVASCULARIZATION N/A 03/03/2022  ? Procedure: Coronary/Graft Acute MI Revascularization;  Surgeon: 03/04/2022, MD;  Location: [facility] INVASIVE CV LAB;  Service: Cardiovascular;  Laterality: N/A;  ? INCISIONAL HERNIA REPAIR N/A 03/18/2018  ? Procedure: INCISIONAL HERNIA REPAIR,  RECTRARECTAL TRANSVERSUS ABDOMINAL RELEASE;  Surgeon: Kinsinger, [facility], MD;  Location: [facility] ORS;  Service: General;  Laterality: N/A;  ? INSERTION OF MESH N/A 03/18/2018  ? Procedure: INSERTION OF MESH;  Surgeon: Kinsinger, Bruce Blanch, MD;  Location: [facility] ORS;  Service: General;  Laterality: N/A;  ? LEFT HEART CATH AND CORONARY ANGIOGRAPHY N/A 03/03/2022  ? Procedure: LEFT HEART CATH AND CORONARY ANGIOGRAPHY;  Surgeon: Bruce Blanch, MD;  Location: [facility] INVASIVE CV LAB;  Service: Cardiovascular;  Laterality: N/A;  ? LIPOMA EXCISION    ? on left shoulder  ? ? ?Current Medications: ?No outpatient medications have been marked as taking for the 03/26/22 encounter (Appointment) with Runell Gess, PA-C.  ?  ? ?Allergies:   Adhesive [tape]  ? ?Social History  ? ?Socioeconomic History  ? Marital status: Married  ?  Spouse  name: Not on file  ? Number of children: Not on file  ? Years of education: Not on file  ? Highest education level: Not on file  ?Occupational History  ? Not on file  ?Tobacco Use  ? Smoking status: Every Day  ?  Packs/day: 2.00  ?  Types: Cigarettes  ? Smokeless tobacco: Never  ?Vaping Use  ? Vaping Use: Never used  ?Substance and Sexual Activity  ? Alcohol use: No  ? Drug use: No  ?  Sexual activity: Not on file  ?Other Topics Concern  ? Not on file  ?Social History Narrative  ? Not on file  ? ?Social Determinants of Health  ? ?Financial Resource Strain: Not on file  ?Food Insecurity: Not on file  ?Transportation Needs: Not on file  ?Physical Activity: Not on file  ?Stress: Not on file  ?Social Connections: Not on file  ?  ? ?Family History: ?The patient's family history is not on file. ? ?ROS:   ?Please see the history of present illness.    ? ?EKGs/Labs/Other Studies Reviewed:   ? ?The following studies were reviewed today: ? ?Left Cardiac Catheterization 03/03/2022: ?  Mid RCA lesion is 100% stenosed. ?  1st Mrg lesion is 90% stenosed. ?  2nd Mrg lesion is 90% stenosed. ?  Mid Cx to Dist Cx lesion is 100% stenosed. ?  A drug-eluting stent was successfully placed using a SYNERGY XD 3.0X16. ?  Post intervention, there is a 0% residual stenosis. ?  There is moderate left ventricular systolic dysfunction. ?  LV end diastolic pressure is moderately elevated. ?  The left ventricular ejection fraction is 35-45% by visual estimate. ? ?Impression: ?Successful PCI and drug-eluting stenting of a large dominant mid RCA occlusion with a door to balloon time of 26 minutes.  His EF is in the 35% range with inferior akinesia.  He will need guideline directed optimal medical therapy including beta-blocker, ACE inhibitor, high-dose statin drug and uninterrupted DAPT for at least 12 months.  The sheath was removed and a TR band was placed on the right wrist to achieve patent hemostasis.  The patient left lab in stable condition. ? ?Diagnostic ?Dominance: Right ?Intervention ? ? ?_______________ ? ?Echocardiogram 03/03/2022: ?Impressions: ? 1. Left ventricular ejection fraction, by estimation, is 55 to 60%. The  ?left ventricle has normal function. The left ventricle has no regional  ?wall motion abnormalities. Left ventricular diastolic parameters are  ?indeterminate. Elevated left ventricular  ?end-diastolic  pressure. There is akinesis of the left ventricular, entire  ?inferior wall.  ? 2. Right ventricular systolic function is normal. The right ventricular  ?size is normal. Tricuspid regurgitation signal is inadequate for assessing  ?PA pressure.  ? 3. The mitral valve is normal in structure. Trivial mitral valve  ?regurgitation. No evidence of mitral stenosis.  ? 4. The aortic valve is tricuspid. Aortic valve regurgitation is not  ?visualized. Aortic valve sclerosis/calcification is present, without any  ?evidence of aortic stenosis.  ? 5. Aortic dilatation noted. There is borderline dilatation of the  ?ascending aorta, measuring 37 mm.  ? 6. The inferior vena cava is normal in size with greater than 50%  ?respiratory variability, suggesting right atrial pressure of 3 mmHg. ? ?EKG:  EKG ordered today. EKG personally reviewed and demonstrates ***. ? ?Recent Labs: ?03/02/2022: ALT 30 ?03/03/2022: BUN 14; Creatinine, Ser 0.96; Hemoglobin 14.8; Platelets 280; Potassium 4.0; Sodium 138  ?Recent Lipid Panel ?   ?Component Value Date/Time  ? CHOL 197  03/02/2022 2326  ? TRIG 431 (H) 03/02/2022 2326  ? HDL 25 (L) 03/02/2022 2326  ? CHOLHDL 7.9 03/02/2022 2326  ? VLDL UNABLE TO CALCULATE IF TRIGLYCERIDE OVER 400 mg/dL 75/64/3329 5188  ? LDLCALC UNABLE TO CALCULATE IF TRIGLYCERIDE OVER 400 mg/dL 41/66/0630 1601  ? LDLDIRECT 103.0 (H) 03/02/2022 2326  ? ? ?Physical Exam:   ? ?Vital Signs: There were no vitals taken for this visit.   ? ?Wt Readings from Last 3 Encounters:  ?03/03/22 192 lb 14.4 oz (87.5 kg)  ?09/19/20 180 lb (81.6 kg)  ?03/18/18 181 lb (82.1 kg)  ?  ? ?General: 66 y.o. male in no acute distress. ?HEENT: Normocephalic and atraumatic. Sclera clear. EOMs intact. ?Neck: Supple. No carotid bruits. No JVD. ?Heart: *** RRR. Distinct S1 and S2. No murmurs, gallops, or rubs. Radial and distal pedal pulses 2+ and equal bilaterally. ?Lungs: No increased work of breathing. Clear to ausculation bilaterally. No wheezes, rhonchi,  or rales.  ?Abdomen: Soft, non-distended, and non-tender to palpation. Bowel sounds present in all 4 quadrants.  ?MSK: Normal strength and tone for age. *** ?Extremities: No lower extremity edema.    ?Skin:

## 2022-03-25 ENCOUNTER — Encounter: Payer: Self-pay | Admitting: Student

## 2022-03-25 DIAGNOSIS — I2510 Atherosclerotic heart disease of native coronary artery without angina pectoris: Secondary | ICD-10-CM | POA: Insufficient documentation

## 2022-03-26 ENCOUNTER — Encounter: Payer: Self-pay | Admitting: Student

## 2022-03-26 ENCOUNTER — Ambulatory Visit (INDEPENDENT_AMBULATORY_CARE_PROVIDER_SITE_OTHER): Payer: BC Managed Care – PPO | Admitting: Student

## 2022-03-26 VITALS — BP 118/72 | HR 75 | Ht 69.0 in | Wt 186.4 lb

## 2022-03-26 DIAGNOSIS — Z720 Tobacco use: Secondary | ICD-10-CM

## 2022-03-26 DIAGNOSIS — E785 Hyperlipidemia, unspecified: Secondary | ICD-10-CM | POA: Diagnosis not present

## 2022-03-26 DIAGNOSIS — I2510 Atherosclerotic heart disease of native coronary artery without angina pectoris: Secondary | ICD-10-CM | POA: Diagnosis not present

## 2022-03-26 DIAGNOSIS — I252 Old myocardial infarction: Secondary | ICD-10-CM | POA: Diagnosis not present

## 2022-03-26 MED ORDER — NICOTINE 21 MG/24HR TD PT24: 21.0000 mg | MEDICATED_PATCH | Freq: Every day | TRANSDERMAL | 0 refills | Status: DC

## 2022-03-26 NOTE — Progress Notes (Signed)
?Cardiology Office Note:   ? ?Date:  03/26/2022  ? ?ID:  Bruce Mcgee, DOB 01-16-1956, MRN [id number] ? ?PCP:  Richmond CampbellKaplan, Kristen W., PA-C  ?Cardiologist:  Nanetta BattyJonathan Berry, MD  ?Electrophysiologist:  None  ? ?Referring MD: Richmond CampbellKaplan, Kristen W., PA-C  ? ?Chief Complaint: hospital follow-up of STEMI ? ?History of Present Illness:   ? ?Bruce Mcgee is a 66 y.o. male with a history of CAD with recent STEMI on 03/03/2022 s/p DES to RCA, hyperlipidemia, hypothyroidism, and tobacco abuse who is followed by Dr. Allyson SabalBerry and presents today for hospital follow-up after recent STEMI. ? ?Patient was first seen by Cardiology during recent hospitalization. He was admitted from 03/02/2022 to 03/04/2022 for an acute STEMI after presenting with chest pain. High-sensitivity troponin peaked at >24,000. Emergent cardiac catheterization showed 100% stenosis of mid RCA, 100% stenosis of mid to distal LCX, 90% stenosis of OM1, and 90% stenosis of OM2. He underwent successful PCI with DES to RCA lesion. Remainder of disease was treated medically. LVEF 35-45% on cath. However, Echo showed LVEF of 55-60% with akinesis of the entire inferior wall. He was started on DAPT with Aspirin and Brilinta as well as a beta-blocker and high-intensity statin. He was not started on ACEi/ARB due to soft BP. ? ?Patient presents today for follow-up. Here with wife. Patient has done well from a cardiac standpoint since recent hospitalization.  He denies any recurrent chest pain.  He describes some dyspnea on exertion but states this has improved.  No orthopnea or PND.  He has had mild lower extremity edema but nothing significant.  No palpitations, lightheadedness, dizziness, syncope.  He is compliant with all of his cardiac medications and is tolerating them well.  He has not required any sublingual nitroglycerin.  He has quit smoking which is a huge accomplishment as he was smoking 60 cigarettes a day prior to this.  He has been using a nicotine patch which seems  to be working well.  He states he will light a cigar and just smell it to help with his cravings but he does not smoke the cigar. ? ?Past Medical History:  ?Diagnosis Date  ? 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 CAD (coronary artery disease)   ? Headache   ? History of kidney stones   ? Hyperlipidemia   ? Hypothyroidism   ? Tobacco abuse   ? ? ?Past Surgical History:  ?Procedure Laterality Date  ? APPENDECTOMY    ? CORONARY/GRAFT ACUTE MI REVASCULARIZATION N/A 03/03/2022  ? Procedure: Coronary/Graft Acute MI Revascularization;  Surgeon: Runell GessBerry, Jonathan J, MD;  [facility];  Service: Cardiovascular;  Laterality: N/A;  ? INCISIONAL HERNIA REPAIR N/A 03/18/2018  ? Procedure: INCISIONAL HERNIA REPAIR,  RECTRARECTAL TRANSVERSUS ABDOMINAL RELEASE;  Surgeon: Kinsinger, De BlanchLuke Aaron, MD;  Location: [facility] ORS;  Service: General;  Laterality: N/A;  ? INSERTION OF MESH N/A 03/18/2018  ? Procedure: INSERTION OF MESH;  Surgeon: Kinsinger, De BlanchLuke Aaron, MD;  Location: [facility] OR[facility];  Service: General;  Laterality: N/A;  ? LEFT HEART CATH AND CORONARY ANGIOGRAPHY N/A 03/03/2022  ? Procedure: LEFT HEART CATH AND CORONARY ANGIOGRAPHY;  Surgeon: Runell GessBerry, Jonathan J, MD;  Location: [facility] INVASIVE CV LAB;  Service: Cardiovascular;  Laterality: N/A;  ? LIPOMA EXCISION    ? on left shoulder  ? ? ?Current Medications: ?Current Meds  ?Medication Sig  ? acetaminophen (TYLENOL) 500 MG tablet Take 1,000 mg by mouth every 6 (six) hours as needed  for mild pain.  ? albuterol (PROVENTIL HFA;VENTOLIN HFA) 108 (90 Base) MCG/ACT inhaler Inhale 2 puffs into the lungs every 4 (four) hours as needed for wheezing or shortness of breath.   ? aspirin 81 MG chewable tablet Chew 1 tablet (81 mg total) by mouth daily.  ? atorvastatin (LIPITOR) 80 MG tablet Take 1 tablet (80 mg total) by mouth daily.  ? calcium carbonate (TUMS - DOSED IN MG ELEMENTAL CALCIUM) 500 MG chewable tablet Chew 1,000 mg by mouth daily as  needed for indigestion or heartburn.  ? icosapent Ethyl (VASCEPA) 1 g capsule Take 2 capsules (2 g total) by mouth 2 (two) times daily.  ? levothyroxine (SYNTHROID, LEVOTHROID) 137 MCG tablet Take 137 mcg by mouth daily.   ? metoprolol tartrate (LOPRESSOR) 25 MG tablet Take 0.5 tablets (12.5 mg total) by mouth 2 (two) times daily.  ? nitroGLYCERIN (NITROSTAT) 0.4 MG SL tablet Place 1 tablet (0.4 mg total) under the tongue every 5 (five) minutes x 3 doses as needed for chest pain.  ? omeprazole (PRILOSEC) 20 MG capsule Take 20 mg by mouth daily.  ? ticagrelor (BRILINTA) 90 MG TABS tablet Take 1 tablet (90 mg total) by mouth 2 (two) times daily.  ? [DISCONTINUED] nicotine (NICODERM CQ - DOSED IN MG/24 HOURS) 21 mg/24hr patch Place 1 patch (21 mg total) onto the skin daily.  ?  ? ?Allergies:   Adhesive [tape]  ? ?Social History  ? ?Socioeconomic History  ? Marital status: Married  ?  Spouse name: Not on file  ? Number of children: Not on file  ? Years of education: Not on file  ? Highest education level: Not on file  ?Occupational History  ? Not on file  ?Tobacco Use  ? Smoking status: Every Day  ?  Packs/day: 2.00  ?  Types: Cigarettes  ? Smokeless tobacco: Never  ?Vaping Use  ? Vaping Use: Never used  ?Substance and Sexual Activity  ? Alcohol use: No  ? Drug use: No  ? Sexual activity: Not on file  ?Other Topics Concern  ? Not on file  ?Social History Narrative  ? Not on file  ? ?Social Determinants of Health  ? ?Financial Resource Strain: Not on file  ?Food Insecurity: Not on file  ?Transportation Needs: Not on file  ?Physical Activity: Not on file  ?Stress: Not on file  ?Social Connections: Not on file  ?  ? ?Family History: ?The patient's family history is not on file. ? ?ROS:   ?Please see the history of present illness.    ? ?EKGs/Labs/Other Studies Reviewed:   ? ?The following studies were reviewed today: ? ?Left Cardiac Catheterization 03/03/2022: ?  Mid RCA lesion is 100% stenosed. ?  1st Mrg lesion is 90%  stenosed. ?  2nd Mrg lesion is 90% stenosed. ?  Mid Cx to Dist Cx lesion is 100% stenosed. ?  A drug-eluting stent was successfully placed using a SYNERGY XD 3.0X16. ?  Post intervention, there is a 0% residual stenosis. ?  There is moderate left ventricular systolic dysfunction. ?  LV end diastolic pressure is moderately elevated. ?  The left ventricular ejection fraction is 35-45% by visual estimate. ? ?Impression: ?Successful PCI and drug-eluting stenting of a large dominant mid RCA occlusion with a door to balloon time of 26 minutes.  His EF is in the 35% range with inferior akinesia.  He will need guideline directed optimal medical therapy including beta-blocker, ACE inhibitor, high-dose statin drug and uninterrupted DAPT  for at least 12 months.  The sheath was removed and a TR band was placed on the right wrist to achieve patent hemostasis.  The patient left lab in stable condition. ? ?Diagnostic ?Dominance: Right ?Intervention ? ? ?_______________ ? ?Echocardiogram 03/03/2022: ?Impressions: ? 1. Left ventricular ejection fraction, by estimation, is 55 to 60%. The  ?left ventricle has normal function. The left ventricle has no regional  ?wall motion abnormalities. Left ventricular diastolic parameters are  ?indeterminate. Elevated left ventricular  ?end-diastolic pressure. There is akinesis of the left ventricular, entire  ?inferior wall.  ? 2. Right ventricular systolic function is normal. The right ventricular  ?size is normal. Tricuspid regurgitation signal is inadequate for assessing  ?PA pressure.  ? 3. The mitral valve is normal in structure. Trivial mitral valve  ?regurgitation. No evidence of mitral stenosis.  ? 4. The aortic valve is tricuspid. Aortic valve regurgitation is not  ?visualized. Aortic valve sclerosis/calcification is present, without any  ?evidence of aortic stenosis.  ? 5. Aortic dilatation noted. There is borderline dilatation of the  ?ascending aorta, measuring 37 mm.  ? 6. The  inferior vena cava is normal in size with greater than 50%  ?respiratory variability, suggesting right atrial pressure of 3 mmHg. ? ?EKG:  EKG ordered today. EKG personally reviewed and demonstrates normal sinus rhyt

## 2022-03-26 NOTE — Patient Instructions (Signed)
Medication Instructions:  ?Your physician recommends that you continue on your current medications as directed. Please refer to the Current Medication list given to you today.  ? ?*If you need a refill on your cardiac medications before your next appointment, please call your pharmacy* ? ? ?Lab Work: ?Your physician recommends that you return for lab work in 2 months ?LIPID panel & LFTs ? ?If you have labs (blood work) drawn today and your tests are completely normal, you will receive your results only by: ?MyChart Message (if you have MyChart) OR ?A paper copy in the mail ?If you have any lab test that is abnormal or we need to change your treatment, we will call you to review the results. ? ? ?Testing/Procedures: ?NONE ordered at this time of appointment  ? ? ? ?Follow-Up: ?At [facility], you and your health needs are our priority.  As part of our continuing mission to provide you with exceptional heart care, we have created designated Provider Care Teams.  These Care Teams include your primary Cardiologist (physician) and Advanced Practice Providers (APPs -  Physician Assistants and Nurse Practitioners) who all work together to provide you with the care you need, when you need it. ? ?We recommend signing up for the patient portal called "MyChart".  Sign up information is provided on this After Visit Summary.  MyChart is used to connect with patients for Virtual Visits (Telemedicine).  Patients are able to view lab/test results, encounter notes, upcoming appointments, etc.  Non-urgent messages can be sent to your provider as well.   ?To learn more about what you can do with MyChart, go to [email or web address].   ? ?Your next appointment:   ?3-4 month(s) ? ?The format for your next appointment:   ?In Person ? ?Provider:   ?Nanetta Batty, MD   ? ? ?Other Instructions ? ? ?Important Information About Sugar ? ? ? ? ?  ?

## 2022-07-15 ENCOUNTER — Encounter: Payer: Self-pay | Admitting: Cardiovascular Disease

## 2022-07-15 ENCOUNTER — Ambulatory Visit: Payer: BC Managed Care – PPO | Attending: Cardiovascular Disease | Admitting: Cardiovascular Disease

## 2022-07-15 DIAGNOSIS — E782 Mixed hyperlipidemia: Secondary | ICD-10-CM

## 2022-07-15 DIAGNOSIS — I252 Old myocardial infarction: Secondary | ICD-10-CM | POA: Diagnosis not present

## 2022-07-15 DIAGNOSIS — Z720 Tobacco use: Secondary | ICD-10-CM | POA: Diagnosis not present

## 2022-07-15 NOTE — Progress Notes (Signed)
07/15/2022 Bruce Mcgee   08-12-1956  [id number]  Primary Physician Richmond Campbell., PA-C Primary Cardiologist: Runell Gess MD FACP, [place], [place], [place]  HPI:  ANDRES BANTZ is a 66 y.o. moderately overweight married Caucasian male father of 2 daughters, grandfather of 3 grandchildren who I last saw at the time of his STEMI 03/02/22.  His primary care provider is Lanice Shirts PA-C.  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  His risk factors include 200 pack years of tobacco abuse having smoked 4 packs a day until the day of his STEMI when I told him to stop.  He does have a history of hyperlipidemia.  There is no family history for heart disease.  He presented around midnight on 03/02/2022 with chest pain and Scotlyn Mccranie ST segment elevation.  I brought him to the Cath Lab and performed radial diagnostic cath revealing occluded large dominant RCA which I stented using a 3 mm x 16 mm long Synergy drug-eluting stent postdilated with 3.5 mm balloon.  He did have moderate circumflex marked marginal branch branch disease.  His EF in the Cath Lab was 35% but the next day his echo revealed an EF of 60% with inferior wall motion abnormality.  He is remained on aspirin and Brilinta as well as high-dose statin therapy and has been asymptomatic.   Current Meds  Medication Sig   acetaminophen (TYLENOL) 500 MG tablet Take 1,000 mg by mouth every 6 (six) hours as needed for mild pain.   albuterol (PROVENTIL HFA;VENTOLIN HFA) 108 (90 Base) MCG/ACT inhaler Inhale 2 puffs into the lungs every 4 (four) hours as needed for wheezing or shortness of breath.    aspirin 81 MG chewable tablet Chew 1 tablet (81 mg total) by mouth daily.   atorvastatin (LIPITOR) 80 MG tablet Take 1 tablet (80 mg total) by mouth daily.   icosapent Ethyl (VASCEPA) 1 g capsule Take 2 capsules (2 g total) by mouth 2 (two) times daily.   levothyroxine (SYNTHROID) 125 MCG tablet Take 1 tablet by  mouth daily.   metoprolol tartrate (LOPRESSOR) 25 MG tablet Take 0.5 tablets (12.5 mg total) by mouth 2 (two) times daily.   omeprazole (PRILOSEC) 20 MG capsule Take 20 mg by mouth daily.   ticagrelor (BRILINTA) 90 MG TABS tablet Take 1 tablet (90 mg total) by mouth 2 (two) times daily.     Allergies  Allergen Reactions   Adhesive [Tape]     Pulls off skin    Latex     Blisters    Social History   Socioeconomic History   Marital status: Married    Spouse name: Not on file   Number of children: Not on file   Years of education: Not on file   Highest education level: Not on file  Occupational History   Not on file  Tobacco Use   Smoking status: Former    Packs/day: 2.00    Types: Cigarettes   Smokeless tobacco: Never   Tobacco comments:    04/03/2022 Patient states he quit smoking  Vaping Use   Vaping Use: Never used  Substance and Sexual Activity   Alcohol use: No   Drug use: No   Sexual activity: Not on file  Other Topics Concern   Not on file  Social History Narrative   Not on file   Social Determinants of Health   Financial Resource Strain: Not on file  Food Insecurity: Not on file  Transportation Needs: Not on file  Physical Activity: Not on file  Stress: Not on file  Social Connections: Not on file  Intimate Partner Violence: Not on file     Review of Systems: General: negative for chills, fever, night sweats or weight changes.  Cardiovascular: negative for chest pain, dyspnea on exertion, edema, orthopnea, palpitations, paroxysmal nocturnal dyspnea or shortness of breath Dermatological: negative for rash Respiratory: negative for cough or wheezing Urologic: negative for hematuria Abdominal: negative for nausea, vomiting, diarrhea, bright red blood per rectum, melena, or hematemesis Neurologic: negative for visual changes, syncope, or dizziness All other systems reviewed and are otherwise negative except as noted above.    Blood pressure 124/74,  pulse 77, height 5\' 9"  (1.753 m), weight 193 lb 9.6 oz (87.8 kg), SpO2 94 %.  General appearance: alert and no distress Neck: no adenopathy, no carotid bruit, no JVD, supple, symmetrical, trachea midline, and thyroid not enlarged, symmetric, no tenderness/mass/nodules Lungs: clear to auscultation bilaterally Heart: regular rate and rhythm, S1, S2 normal, no murmur, click, rub or gallop Extremities: extremities normal, atraumatic, no cyanosis or edema Pulses: 2+ and symmetric Skin: Skin color, texture, turgor normal. No rashes or lesions Neurologic: Grossly normal  EKG not performed today  ASSESSMENT AND PLAN:   History of ST elevation myocardial infarction (STEMI) History of CAD status post inferior STEMI/20/23.  To complete Cath Lab at 1 in the morning and demonstrated an occluded mid large dominant RCA with moderate circumflex obtuse marginal branch disease.  His EF in the Cath Lab was in the 35% range however the next day his EF was 60% with inferior wall motion abnormality.  The door to balloon time was 26 minutes.  I placed a 3 mm x 16 mm long Synergy drug-eluting stent postdilated with a 3.5 mm balloon.  He was placed on aspirin and Brilinta.  He has had no further symptoms.  Tobacco abuse History of over 200 pack years of tobacco abuse having smoked 4 packs a day for 50 years and stopped on the day of his STEMI.  Mixed hyperlipidemia History of hyperlipidemia on high-dose statin therapy with lipid profile recently performed 1 month ago revealing a total cholesterol of 124, LDL 75 and HDL 32.     11-05-1976 MD [facility], [facility] 07/15/2022 3:58 PM

## 2022-07-15 NOTE — Patient Instructions (Signed)
Medication Instructions:  Your physician recommends that you continue on your current medications as directed. Please refer to the Current Medication list given to you today.  *If you need a refill on your cardiac medications before your next appointment, please call your pharmacy*   Follow-Up: At [facility], you and your health needs are our priority.  As part of our continuing mission to provide you with exceptional heart care, we have created designated Provider Care Teams.  These Care Teams include your primary Cardiologist (physician) and Advanced Practice Providers (APPs -  Physician Assistants and Nurse Practitioners) who all work together to provide you with the care you need, when you need it.  We recommend signing up for the patient portal called "MyChart".  Sign up information is provided on this After Visit Summary.  MyChart is used to connect with patients for Virtual Visits (Telemedicine).  Patients are able to view lab/test results, encounter notes, upcoming appointments, etc.  Non-urgent messages can be sent to your provider as well.   To learn more about what you can do with MyChart, go to [email or web address].    Your next appointment:   6 month(s)  The format for your next appointment:   In Person  Provider:   Nanetta Batty, MD     Other Instructions  Heart-Healthy Eating Plan Eating a healthy diet is important for the health of your heart. A heart-healthy eating plan includes: Eating less unhealthy fats. Eating more healthy fats. Eating less salt in your food. Salt is also called sodium. Making other changes in your diet. What are tips for following this plan? Cooking Avoid frying your food. Try to bake, boil, grill, or broil it instead. You can also reduce fat by: Removing the skin from poultry. Removing all visible fats from meats. Steaming vegetables in water or broth. Meal planning  At meals, divide your plate into four equal parts: Fill  one-half of your plate with vegetables and green salads. Fill one-fourth of your plate with whole grains. Fill one-fourth of your plate with lean protein foods. Eat 2-4 cups of vegetables per day. One cup of vegetables is: 1 cup (91 g) broccoli or cauliflower florets. 2 medium carrots. 1 large bell pepper. 1 large sweet potato. 1 large tomato. 1 medium white potato. 2 cups (150 g) raw leafy greens. Eat 1-2 cups of fruit per day. One cup of fruit is: 1 small apple 1 large banana 1 cup (237 g) mixed fruit, 1 large orange,  cup (82 g) dried fruit, 1 cup (240 mL) 100% fruit juice. Eat more foods that have soluble fiber. These are apples, broccoli, carrots, beans, peas, and barley. Try to get 20-30 g of fiber per day. Eat 4-5 servings of nuts, legumes, and seeds per week: 1 serving of dried beans or legumes equals  cup (90 g) cooked. 1 serving of nuts is  oz (12 almonds, 24 pistachios, or 7 walnut halves). 1 serving of seeds equals  oz (8 g). General information Eat more home-cooked food. Eat less restaurant, buffet, and fast food. Limit or avoid alcohol. Limit foods that are high in starch and sugar. Avoid fried foods. Lose weight if you are overweight. Keep track of how much salt (sodium) you eat. This is important if you have high blood pressure. Ask your doctor to tell you more about this. Try to add vegetarian meals each week. Fats Choose healthy fats. These include olive oil and canola oil, flaxseeds, walnuts, almonds, and seeds. Eat more omega-3 fats.  These include salmon, mackerel, sardines, tuna, flaxseed oil, and ground flaxseeds. Try to eat fish at least 2 times each week. Check food labels. Avoid foods with trans fats or high amounts of saturated fat. Limit saturated fats. These are often found in animal products, such as meats, butter, and cream. These are also found in plant foods, such as palm oil, palm kernel oil, and coconut oil. Avoid foods with partially  hydrogenated oils in them. These have trans fats. Examples are stick margarine, some tub margarines, cookies, crackers, and other baked goods. What foods should I eat? Fruits All fresh, canned (in natural juice), or frozen fruits. Vegetables Fresh or frozen vegetables (raw, steamed, roasted, or grilled). Green salads. Grains Most grains. Choose whole wheat and whole grains most of the time. Rice and pasta, including brown rice and pastas made with whole wheat. Meats and other proteins Lean, well-trimmed beef, veal, pork, and lamb. Chicken and [place] without skin. All fish and shellfish. Wild duck, rabbit, pheasant, and venison. Egg whites or low-cholesterol egg substitutes. Dried beans, peas, lentils, and tofu. Seeds and most nuts. Dairy Low-fat or nonfat cheeses, including ricotta and mozzarella. Skim or 1% milk that is liquid, powdered, or evaporated. Buttermilk that is made with low-fat milk. Nonfat or low-fat yogurt. Fats and oils Non-hydrogenated (trans-free) margarines. Vegetable oils, including soybean, sesame, sunflower, olive, peanut, safflower, corn, canola, and cottonseed. Salad dressings or mayonnaise made with a vegetable oil. Beverages Mineral water. Coffee and tea. Diet carbonated beverages. Sweets and desserts Sherbet, gelatin, and fruit ice. Small amounts of dark chocolate. Limit all sweets and desserts. Seasonings and condiments All seasonings and condiments. The items listed above may not be a complete list of foods and drinks you can eat. Contact a dietitian for more options. What foods should I avoid? Fruits Canned fruit in heavy syrup. Fruit in cream or butter sauce. Fried fruit. Limit coconut. Vegetables Vegetables cooked in cheese, cream, or butter sauce. Fried vegetables. Grains Breads that are made with saturated or trans fats, oils, or whole milk. Croissants. Sweet rolls. Donuts. High-fat crackers, such as cheese crackers. Meats and other proteins Fatty meats,  such as hot dogs, ribs, sausage, bacon, rib-eye roast or steak. High-fat deli meats, such as salami and bologna. Caviar. Domestic duck and goose. Organ meats, such as liver. Dairy Cream, sour cream, cream cheese, and creamed cottage cheese. Whole-milk cheeses. Whole or 2% milk that is liquid, evaporated, or condensed. Whole buttermilk. Cream sauce or high-fat cheese sauce. Yogurt that is made from whole milk. Fats and oils Meat fat, or shortening. Cocoa butter, hydrogenated oils, palm oil, coconut oil, palm kernel oil. Solid fats and shortenings, including bacon fat, salt pork, lard, and butter. Nondairy cream substitutes. Salad dressings with cheese or sour cream. Beverages Regular sodas and juice drinks with added sugar. Sweets and desserts Frosting. Pudding. Cookies. Cakes. Pies. Milk chocolate or white chocolate. Buttered syrups. Full-fat ice cream or ice cream drinks. The items listed above may not be a complete list of foods and drinks to avoid. Contact a dietitian for more information. Summary Heart-healthy meal planning includes eating less unhealthy fats, eating more healthy fats, and making other changes in your diet. Eat a balanced diet. This includes fruits and vegetables, low-fat or nonfat dairy, lean protein, nuts and legumes, whole grains, and heart-healthy oils and fats. This information is not intended to replace advice given to you by your health care provider. Make sure you discuss any questions you have with your health care provider. Document  Revised: 12/02/2021 Document Reviewed: 12/02/2021 Elsevier Patient Education  [place].

## 2022-07-15 NOTE — Assessment & Plan Note (Signed)
History of CAD status post inferior STEMI/20/23.  To complete Cath Lab at 1 in the morning and demonstrated an occluded mid large dominant RCA with moderate circumflex obtuse marginal branch disease.  His EF in the Cath Lab was in the 35% range however the next day his EF was 60% with inferior wall motion abnormality.  The door to balloon time was 26 minutes.  I placed a 3 mm x 16 mm long Synergy drug-eluting stent postdilated with a 3.5 mm balloon.  He was placed on aspirin and Brilinta.  He has had no further symptoms.

## 2022-07-15 NOTE — Assessment & Plan Note (Signed)
History of hyperlipidemia on high-dose statin therapy with lipid profile recently performed 1 month ago revealing a total cholesterol of 124, LDL 75 and HDL 32.

## 2022-07-15 NOTE — Assessment & Plan Note (Signed)
History of over 200 pack years of tobacco abuse having smoked 4 packs a day for 50 years and stopped on the day of his STEMI.

## 2022-08-19 ENCOUNTER — Other Ambulatory Visit: Payer: Self-pay | Admitting: Student

## 2022-10-04 IMAGING — DX DG CHEST 1V PORT
1 series · 1 of 1 positions shown · non-contrast
Comparison: 12/19/2016

CLINICAL DATA: Chest pain, shortness of breath

EXAM:
PORTABLE CHEST 1 VIEW

[chest]
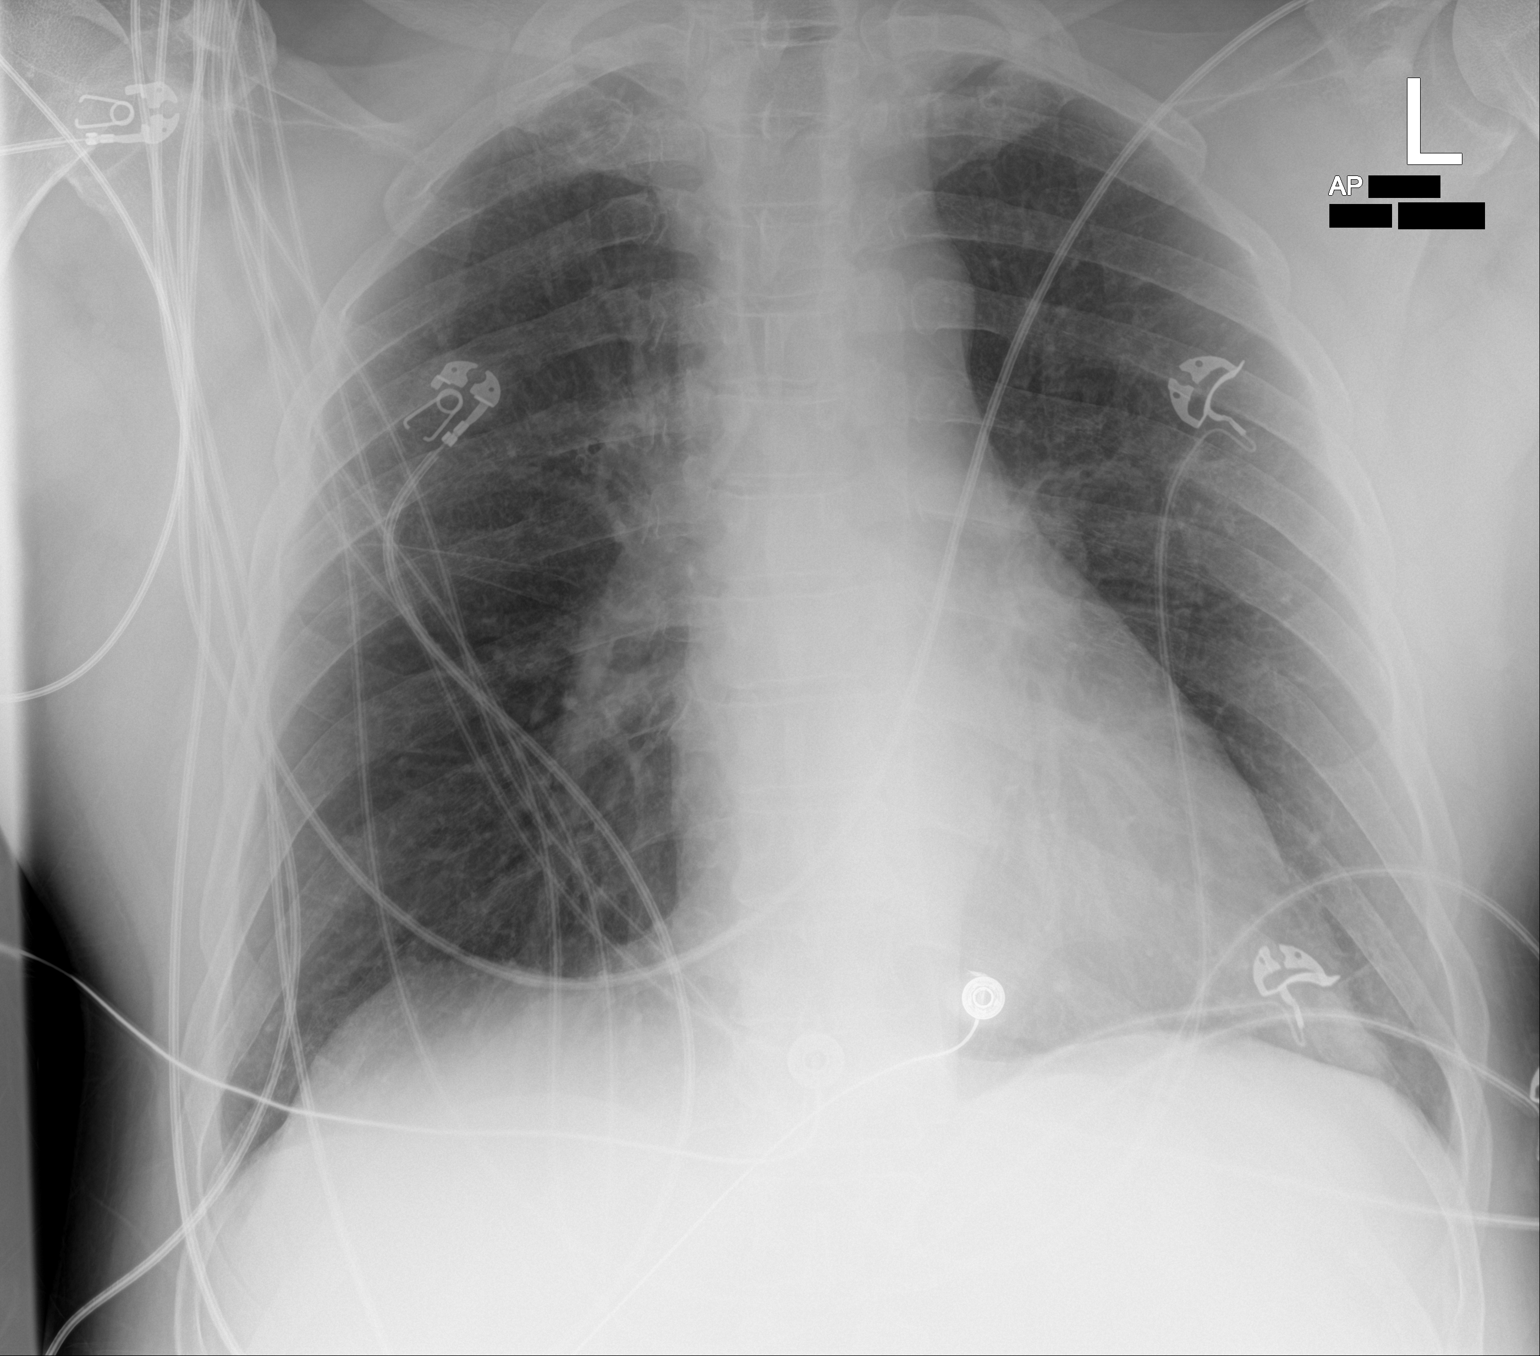

[1 of 1 positions shown; findings below may reference images not displayed]

FINDINGS: Lungs are clear.  No pleural effusion or pneumothorax.

The heart is normal in size.
IMPRESSION: No evidence of acute cardiopulmonary disease.

## 2023-01-20 ENCOUNTER — Encounter: Payer: Self-pay | Admitting: Cardiovascular Disease

## 2023-01-20 ENCOUNTER — Ambulatory Visit: Payer: BC Managed Care – PPO | Attending: Cardiovascular Disease | Admitting: Cardiovascular Disease

## 2023-01-20 VITALS — BP 124/74 | HR 69 | Ht 69.0 in | Wt 194.0 lb

## 2023-01-20 DIAGNOSIS — Z720 Tobacco use: Secondary | ICD-10-CM | POA: Diagnosis not present

## 2023-01-20 DIAGNOSIS — I252 Old myocardial infarction: Secondary | ICD-10-CM

## 2023-01-20 DIAGNOSIS — E782 Mixed hyperlipidemia: Secondary | ICD-10-CM

## 2023-01-20 LAB — HEPATIC FUNCTION PANEL
ALT: 27 IU/L (ref 0–44)
AST: 24 IU/L (ref 0–40)
Albumin: 3.9 g/dL (ref 3.9–4.9)
Alkaline Phosphatase: 174 IU/L — ABNORMAL HIGH (ref 44–121)
Bilirubin Total: 0.7 mg/dL (ref 0.0–1.2)
Bilirubin, Direct: 0.18 mg/dL (ref 0.00–0.40)
Total Protein: 6.6 g/dL (ref 6.0–8.5)

## 2023-01-20 LAB — LIPID PANEL
Chol/HDL Ratio: 4.9 ratio (ref 0.0–5.0)
Cholesterol, Total: 132 mg/dL (ref 100–199)
HDL: 27 mg/dL — ABNORMAL LOW (ref >39)
LDL Chol Calc (NIH): 81 mg/dL (ref 0–99)
Triglycerides: 131 mg/dL (ref 0–149)
VLDL Cholesterol Cal: 24 mg/dL (ref 5–40)

## 2023-01-20 MED ORDER — TICAGRELOR 60 MG PO TABS: 60.0000 mg | ORAL_TABLET | Freq: Two times a day (BID) | ORAL | 3 refills | Status: DC

## 2023-01-20 NOTE — Patient Instructions (Signed)
Medication Instructions:  Your physician has recommended you make the following change in your medication:   -Stop Brilinta '90mg'$ .  -Start Brilinta '60mg'$  twice daily.  *If you need a refill on your cardiac medications before your next appointment, please call your pharmacy*   Lab Work: Your physician recommends that you have labs drawn today: Lipid/liver panel  If you have labs (blood work) drawn today and your tests are completely normal, you will receive your results only by: MyChart Message (if you have MyChart) OR A paper copy in the mail If you have any lab test that is abnormal or we need to change your treatment, we will call you to review the results.   Follow-Up: At [facility], you and your health needs are our priority.  As part of our continuing mission to provide you with exceptional heart care, we have created designated Provider Care Teams.  These Care Teams include your primary Cardiologist (physician) and Advanced Practice Providers (APPs -  Physician Assistants and Nurse Practitioners) who all work together to provide you with the care you need, when you need it.  We recommend signing up for the patient portal called "MyChart".  Sign up information is provided on this After Visit Summary.  MyChart is used to connect with patients for Virtual Visits (Telemedicine).  Patients are able to view lab/test results, encounter notes, upcoming appointments, etc.  Non-urgent messages can be sent to your provider as well.   To learn more about what you can do with MyChart, go to [email or web address].    Your next appointment:   12 month(s)  Provider:   Quay Burow, MD

## 2023-01-20 NOTE — Assessment & Plan Note (Signed)
History of hyperlipidemia on high-dose statin therapy with lipid profile performed/23/23 revealing total cholesterol 197.  We will recheck a lipid liver profile today.

## 2023-01-20 NOTE — Progress Notes (Signed)
01/20/2023 Bruce Mcgee   August 05, 1956  [id number]  Primary Physician Aletha Halim., PA-C Primary Cardiologist: Lorretta Harp MD FACP, [place], [place], [place]  HPI:  Bruce Mcgee is a 67 y.o.  moderately overweight married Caucasian male father of 2 daughters, grandfather of 3 grandchildren who I last saw in the office 07/15/2022.  His primary care provider is Algernon Huxley PA-C.  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  His risk factors include 200 pack years of tobacco abuse having smoked 4 packs a day until the day of his STEMI when I told him to stop.  He does have a history of hyperlipidemia.  There is no family history for heart disease.  He presented around midnight on 03/02/2022 with chest pain and Joyel Chenette ST segment elevation.  I brought him to the Cath Lab and performed radial diagnostic cath revealing occluded large dominant RCA which I stented using a 3 mm x 16 mm long Synergy drug-eluting stent postdilated with 3.5 mm balloon.  He did have moderate circumflex marked marginal branch branch disease.  His EF in the Cath Lab was 35% but the next day his echo revealed an EF of 60% with inferior wall motion abnormality.  He is remained on aspirin and Brilinta as well as high-dose statin therapy and has been asymptomatic.  Send I saw him several months ago he continues to do well.  He still not smoking.  He remains on dual antiplatelet therapy.  He denies chest pain or shortness of breath.   Current Meds  Medication Sig   acetaminophen (TYLENOL) 500 MG tablet Take 1,000 mg by mouth every 6 (six) hours as needed for mild pain.   albuterol (PROVENTIL HFA;VENTOLIN HFA) 108 (90 Base) MCG/ACT inhaler Inhale 2 puffs into the lungs every 4 (four) hours as needed for wheezing or shortness of breath.    aspirin 81 MG chewable tablet Chew 1 tablet (81 mg total) by mouth daily.   atorvastatin (LIPITOR) 80 MG tablet Take 1 tablet (80 mg total) by mouth  daily.   icosapent Ethyl (VASCEPA) 1 g capsule Take 2 capsules (2 g total) by mouth 2 (two) times daily.   levothyroxine (SYNTHROID) 125 MCG tablet Take 1 tablet by mouth daily.   metoprolol tartrate (LOPRESSOR) 25 MG tablet Take 0.5 tablets (12.5 mg total) by mouth 2 (two) times daily.   nicotine (NICODERM CQ - DOSED IN MG/24 HOURS) 21 mg/24hr patch Place 1 patch (21 mg total) onto the skin daily.   nitroGLYCERIN (NITROSTAT) 0.4 MG SL tablet Place 1 tablet (0.4 mg total) under the tongue every 5 (five) minutes x 3 doses as needed for chest pain.   omeprazole (PRILOSEC) 20 MG capsule Take 20 mg by mouth daily.   ticagrelor (BRILINTA) 90 MG TABS tablet Take 1 tablet (90 mg total) by mouth 2 (two) times daily.     Allergies  Allergen Reactions   Adhesive [Tape]     Pulls off skin    Latex     Blisters    Social History   Socioeconomic History   Marital status: Married    Spouse name: Not on file   Number of children: Not on file   Years of education: Not on file   Highest education level: Not on file  Occupational History   Not on file  Tobacco Use   Smoking status: Former    Packs/day: 2.00  Types: Cigarettes   Smokeless tobacco: Never   Tobacco comments:    04/03/2022 Patient states he quit smoking  Vaping Use   Vaping Use: Never used  Substance and Sexual Activity   Alcohol use: No   Drug use: No   Sexual activity: Not on file  Other Topics Concern   Not on file  Social History Narrative   Not on file   Social Determinants of Health   Financial Resource Strain: Not on file  Food Insecurity: Not on file  Transportation Needs: Not on file  Physical Activity: Not on file  Stress: Not on file  Social Connections: Not on file  Intimate Partner Violence: Not on file     Review of Systems: General: negative for chills, fever, night sweats or weight changes.  Cardiovascular: negative for chest pain, dyspnea on exertion, edema, orthopnea, palpitations, paroxysmal  nocturnal dyspnea or shortness of breath Dermatological: negative for rash Respiratory: negative for cough or wheezing Urologic: negative for hematuria Abdominal: negative for nausea, vomiting, diarrhea, bright red blood per rectum, melena, or hematemesis Neurologic: negative for visual changes, syncope, or dizziness All other systems reviewed and are otherwise negative except as noted above.    Blood pressure 124/74, pulse 69, height '5\' 9"'$  (1.753 m), weight 194 lb (88 kg), SpO2 96 %.  General appearance: alert and icteric Neck: no adenopathy, no carotid bruit, no JVD, supple, symmetrical, trachea midline, and thyroid not enlarged, symmetric, no tenderness/mass/nodules Lungs: clear to auscultation bilaterally Heart: regular rate and rhythm, S1, S2 normal, no murmur, click, rub or gallop Extremities: extremities normal, atraumatic, no cyanosis or edema Pulses: 2+ and symmetric Skin: Skin color, texture, turgor normal. No rashes or lesions Neurologic: Grossly normal  EKG sinus rhythm at 69 with a rightward axis and incomplete right bundle branch block.  I personally reviewed this EKG.  ASSESSMENT AND PLAN:   History of ST elevation myocardial infarction (STEMI) History of inferior STEMI 03/02/2022.  Cardiac catheterization performed radially by myself revealed an occluded dominant RCA which I stented using a 3 mm x 16 mm long Synergy drug-eluting stent postdilated to 3.5 mm.  He did have moderate circumflex and marginal branch disease.  He is remained asymptomatic on aspirin and Brilinta.  I am going to discontinue his Brilinta and transition him to clopidogrel 75 mg once a day.  Tobacco abuse Discontinued  Mixed hyperlipidemia History of hyperlipidemia on high-dose statin therapy with lipid profile performed/23/23 revealing total cholesterol 197.  We will recheck a lipid liver profile today.     Lorretta Harp MD FACP,FACC,FAHA, [facility] 01/20/2023 11:11 AM

## 2023-01-20 NOTE — Assessment & Plan Note (Signed)
History of inferior STEMI 03/02/2022.  Cardiac catheterization performed radially by myself revealed an occluded dominant RCA which I stented using a 3 mm x 16 mm long Synergy drug-eluting stent postdilated to 3.5 mm.  He did have moderate circumflex and marginal branch disease.  He is remained asymptomatic on aspirin and Brilinta.  I am going to discontinue his Brilinta and transition him to clopidogrel 75 mg once a day.

## 2023-01-20 NOTE — Assessment & Plan Note (Signed)
Discontinued

## 2023-01-26 ENCOUNTER — Telehealth: Payer: Self-pay | Admitting: Cardiovascular Disease

## 2023-01-26 ENCOUNTER — Other Ambulatory Visit: Payer: Self-pay | Admitting: Emergency Medicine

## 2023-01-26 DIAGNOSIS — E782 Mixed hyperlipidemia: Secondary | ICD-10-CM

## 2023-01-26 MED ORDER — EZETIMIBE 10 MG PO TABS: 10.0000 mg | ORAL_TABLET | Freq: Every day | ORAL | 3 refills | Status: DC

## 2023-01-26 NOTE — Telephone Encounter (Signed)
LDL 81, not at goal for secondary prevention on high-dose atorvastatin, add Zetia 10 mg a day and repeat FLP in 3 months.  Dr Gwenlyn Found  Pt returned call- given the message above. Sent in prescription to preferred pharmacy. Order placed for lipid panel- pt told he will need to be fasting. He verbalized understanding.

## 2023-01-26 NOTE — Telephone Encounter (Signed)
Left message to call back  

## 2023-01-26 NOTE — Telephone Encounter (Signed)
Pt is returning [place] call regarding labs.

## 2023-02-11 ENCOUNTER — Other Ambulatory Visit: Payer: Self-pay | Admitting: Cardiology

## 2023-04-21 ENCOUNTER — Other Ambulatory Visit: Payer: Self-pay

## 2023-04-21 MED ORDER — EZETIMIBE 10 MG PO TABS: 10.0000 mg | ORAL_TABLET | Freq: Every day | ORAL | 4 refills | Status: DC

## 2023-04-28 ENCOUNTER — Other Ambulatory Visit: Payer: Self-pay | Admitting: Cardiology

## 2023-05-12 ENCOUNTER — Other Ambulatory Visit: Payer: Self-pay | Admitting: Cardiovascular Disease

## 2023-05-15 ENCOUNTER — Other Ambulatory Visit: Payer: Self-pay | Admitting: Cardiovascular Disease

## 2023-05-18 ENCOUNTER — Other Ambulatory Visit: Payer: Self-pay | Admitting: Cardiovascular Disease

## 2023-05-22 ENCOUNTER — Other Ambulatory Visit: Payer: Self-pay | Admitting: Cardiovascular Disease

## 2023-08-11 ENCOUNTER — Other Ambulatory Visit: Payer: Self-pay | Admitting: Cardiovascular Disease

## 2023-11-17 ENCOUNTER — Telehealth: Payer: Self-pay | Admitting: Cardiovascular Disease

## 2023-11-17 ENCOUNTER — Other Ambulatory Visit: Payer: Self-pay | Admitting: Cardiovascular Disease

## 2023-11-17 MED ORDER — TICAGRELOR 60 MG PO TABS: 60.0000 mg | ORAL_TABLET | Freq: Two times a day (BID) | ORAL | 0 refills | Status: DC

## 2023-11-17 MED ORDER — NITROGLYCERIN 0.4 MG SL SUBL: 0.4000 mg | SUBLINGUAL_TABLET | SUBLINGUAL | 2 refills | Status: AC | PRN

## 2023-11-17 MED ORDER — TICAGRELOR 90 MG PO TABS: 90.0000 mg | ORAL_TABLET | Freq: Two times a day (BID) | ORAL | 0 refills | Status: DC

## 2023-11-17 MED ORDER — ATORVASTATIN CALCIUM 80 MG PO TABS: 80.0000 mg | ORAL_TABLET | Freq: Every day | ORAL | 0 refills | Status: DC

## 2023-11-17 NOTE — Telephone Encounter (Signed)
 Called and spoke to patient. Verified name and DOB.Called to verify patient is currently taking Brilinta 60 mg. Patient stated he is taking 60 mg. New prescription sent in. I called [facility] to cancel the 90 mg prescription.

## 2023-11-17 NOTE — Telephone Encounter (Signed)
 Pt c/o medication issue:  1. Name of Medication:   ticagrelor  (BRILINTA ) 90 MG TABS tablet    2. How are you currently taking this medication (dosage and times per day)? As written   3. Are you having a reaction (difficulty breathing--STAT)? No   4. What is your medication issue? Jonelle, pharmacist with [facility], called in to clarify which dosage pt should be taking. Last appt notes states to stop 90mg  and start 60mg , however 90mg  was sent in today.  Please advise.

## 2023-11-17 NOTE — Telephone Encounter (Signed)
*  STAT* If patient is at the pharmacy, call can be transferred to refill team.   1. Which medications need to be refilled? (please list name of each medication and dose if known) atorvastatin  (LIPITOR ) 80 MG tablet   BRILINTA  90 MG TABS tablet   nitroGLYCERIN  (NITROSTAT ) 0.4 MG SL tablet   VASCEPA  1 g capsule   2. Which pharmacy/location (including street and city if local pharmacy) is medication to be sent to?  [id number] - [place], [place] - [address]    3. Do they need a 30 day or 90 day supply? 90   Patient has appt 3/7

## 2023-11-17 NOTE — Telephone Encounter (Signed)
 Pt's medications were sent to pt's pharmacy as requested. Confirmation received.

## 2023-11-19 ENCOUNTER — Telehealth: Payer: Self-pay | Admitting: Cardiovascular Disease

## 2023-11-19 NOTE — Telephone Encounter (Signed)
 Pt c/o medication issue:  1. Name of Medication:  ticagrelor  (BRILINTA ) 60 MG TABS tablet Take 1 tablet (60 mg total) by mouth 2 (two) times daily.    VASCEPA  1 g capsule Take 2 capsules (2 g total) by mouth 2 (two) times daily. Please call [phone] to schedule a March appointment with Dr. Dorn Lesches. Thank you.         2. How are you currently taking this medication (dosage and times per day)? As written   3. Are you having a reaction (difficulty breathing--STAT)? No   4. What is your medication issue? Pt states these medication cost him $400 since his insurance changed, please advise of other options.

## 2023-11-19 NOTE — Telephone Encounter (Signed)
 Patient identification verified by 2 forms. Bertina Cooks, RN    Called and spoke to patient  Patient states:   -unsure if $400 is deductible cost for insurance  Advised patient:   -outreach insurance to review plan/coverage   -if this is not deductible issue, outreach office for assistance  Patient verbalized understanding, no questions at this time

## 2024-01-15 ENCOUNTER — Encounter: Payer: Self-pay | Admitting: Cardiovascular Disease

## 2024-01-15 ENCOUNTER — Ambulatory Visit: Payer: BC Managed Care – PPO | Attending: Cardiovascular Disease | Admitting: Cardiovascular Disease

## 2024-01-15 VITALS — BP 132/76 | HR 56 | Ht 69.0 in | Wt 203.0 lb

## 2024-01-15 DIAGNOSIS — I252 Old myocardial infarction: Secondary | ICD-10-CM | POA: Diagnosis not present

## 2024-01-15 DIAGNOSIS — Z720 Tobacco use: Secondary | ICD-10-CM | POA: Diagnosis not present

## 2024-01-15 DIAGNOSIS — E782 Mixed hyperlipidemia: Secondary | ICD-10-CM | POA: Diagnosis not present

## 2024-01-15 DIAGNOSIS — I2510 Atherosclerotic heart disease of native coronary artery without angina pectoris: Secondary | ICD-10-CM | POA: Diagnosis not present

## 2024-01-15 NOTE — Assessment & Plan Note (Signed)
 History of CAD status post inferior STEMI when he presented at midnight on 03/02/2022 with inferior ST segment elevation.  I cathed him radially revealing a large occluded dominant RCA which I stented with a 3 mm x 16 mm long Synergy drug-eluting stent postdilated with a 3.5 mm balloon.  He did have moderate circumflex and marked marginal branch disease.  His EF initially in the Cath Lab was 35% over the next day his EF by echo was 60% with an inferior wall motion abnormality.  He was on aspirin and Brilinta and currently is on aspirin 11.  He denies chest pain or shortness of breath.

## 2024-01-15 NOTE — Assessment & Plan Note (Signed)
 Continued tobacco use at the time of his STEMI

## 2024-01-15 NOTE — Patient Instructions (Signed)

## 2024-01-15 NOTE — Progress Notes (Signed)
 01/15/2024 BYARD CARRANZA   01-Oct-1956  [id number]  Primary Physician Richmond Campbell., PA-C Primary Cardiologist: Runell Gess MD FACP, [place], [place], [place]  HPI:  Bruce Mcgee is a 68 y.o.  moderately overweight married Caucasian male father of 2 daughters, grandfather of 3 grandchildren who I last saw in the office 01/20/2023.  His primary care provider is Lanice Shirts PA-C.  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  His risk factors include 200 pack years of tobacco abuse having smoked 4 packs a day until the day of his STEMI when I told him to stop.  He does have a history of hyperlipidemia.  There is no family history for heart disease.  He presented around midnight on 03/02/2022 with chest pain and Adelyn Roscher ST segment elevation.  I brought him to the Cath Lab and performed radial diagnostic cath revealing occluded large dominant RCA which I stented using a 3 mm x 16 mm long Synergy drug-eluting stent postdilated with 3.5 mm balloon.  He did have moderate circumflex marked marginal branch branch disease.  His EF in the Cath Lab was 35% but the next day his echo revealed an EF of 60% with inferior wall motion abnormality.  He is remained on aspirin and Brilinta as well as high-dose statin therapy and has been asymptomatic.   Send I saw him in the office 1 year ago he continues to do well.  He is currently only on a baby aspirin.  He denies chest pain or shortness of breath.  He did express a desire to obtain a part-time job.   Current Meds  Medication Sig   acetaminophen (TYLENOL) 500 MG tablet Take 1,000 mg by mouth every 6 (six) hours as needed for mild pain.   albuterol (PROVENTIL HFA;VENTOLIN HFA) 108 (90 Base) MCG/ACT inhaler Inhale 2 puffs into the lungs every 4 (four) hours as needed for wheezing or shortness of breath.    aspirin 81 MG chewable tablet Chew 1 tablet (81 mg total) by mouth daily.   atorvastatin (LIPITOR) 80 MG tablet  Take 1 tablet (80 mg total) by mouth daily.   ezetimibe (ZETIA) 10 MG tablet Take 1 tablet (10 mg total) by mouth daily.   fluticasone (FLONASE) 50 MCG/ACT nasal spray Place 2 sprays into both nostrils as needed.   furosemide (LASIX) 20 MG tablet Take 20 mg by mouth as needed.   levothyroxine (SYNTHROID) 125 MCG tablet Take 1 tablet by mouth daily.   metoprolol tartrate (LOPRESSOR) 25 MG tablet Take 1/2 (one-half) tablet by mouth twice daily   nicotine (NICODERM CQ - DOSED IN MG/24 HOURS) 21 mg/24hr patch Place 1 patch (21 mg total) onto the skin daily.   nitroGLYCERIN (NITROSTAT) 0.4 MG SL tablet Place 1 tablet (0.4 mg total) under the tongue every 5 (five) minutes as needed for chest pain.   omeprazole (PRILOSEC) 20 MG capsule Take 20 mg by mouth daily.   ticagrelor (BRILINTA) 60 MG TABS tablet Take 1 tablet (60 mg total) by mouth 2 (two) times daily.   VASCEPA 1 g capsule Take 2 capsules (2 g total) by mouth 2 (two) times daily. Please call [phone] to schedule a March appointment with Dr. Nanetta Batty. Thank you.     Allergies  Allergen Reactions   Adhesive [Tape]     Pulls off skin    Latex     Blisters    Social History  Socioeconomic History   Marital status: Married    Spouse name: Not on file   Number of children: Not on file   Years of education: Not on file   Highest education level: Not on file  Occupational History   Not on file  Tobacco Use   Smoking status: Former    Current packs/day: 2.00    Types: Cigarettes   Smokeless tobacco: Never   Tobacco comments:    04/03/2022 Patient states he quit smoking  Vaping Use   Vaping status: Never Used  Substance and Sexual Activity   Alcohol use: No   Drug use: No   Sexual activity: Not on file  Other Topics Concern   Not on file  Social History Narrative   Not on file   Social Drivers of Health   Financial Resource Strain: Not on file  Food Insecurity: Not on file  Transportation Needs: Not on file   Physical Activity: Not on file  Stress: Not on file  Social Connections: Not on file  Intimate Partner Violence: Not on file     Review of Systems: General: negative for chills, fever, night sweats or weight changes.  Cardiovascular: negative for chest pain, dyspnea on exertion, edema, orthopnea, palpitations, paroxysmal nocturnal dyspnea or shortness of breath Dermatological: negative for rash Respiratory: negative for cough or wheezing Urologic: negative for hematuria Abdominal: negative for nausea, vomiting, diarrhea, bright red blood per rectum, melena, or hematemesis Neurologic: negative for visual changes, syncope, or dizziness All other systems reviewed and are otherwise negative except as noted above.    Blood pressure 132/76, pulse (!) 56, height 5\' 9"  (1.753 m), weight 203 lb (92.1 kg), SpO2 95%.  General appearance: alert and no distress Neck: no adenopathy, no carotid bruit, no JVD, supple, symmetrical, trachea midline, and thyroid not enlarged, symmetric, no tenderness/mass/nodules Lungs: clear to auscultation bilaterally Heart: regular rate and rhythm, S1, S2 normal, no murmur, click, rub or gallop Extremities: extremities normal, atraumatic, no cyanosis or edema Pulses: 2+ and symmetric Skin: Skin color, texture, turgor normal. No rashes or lesions Neurologic: Grossly normal  EKG EKG Interpretation Date/Time:  Friday January 15 2024 09:01:06 EST Ventricular Rate:  56 PR Interval:  178 QRS Duration:  100 QT Interval:  428 QTC Calculation: 413 R Axis:   90  Text Interpretation: Sinus bradycardia Rightward axis When compared with ECG of 04-Mar-2022 06:57, T wave inversion no longer evident in Inferior leads QT has shortened Confirmed by Nanetta Batty [phone]) on 01/15/2024 9:29:26 AM    ASSESSMENT AND PLAN:   History of ST elevation myocardial infarction (STEMI) History of CAD status post inferior STEMI when he presented at midnight on 03/02/2022 with inferior ST  segment elevation.  I cathed him radially revealing a large occluded dominant RCA which I stented with a 3 mm x 16 mm long Synergy drug-eluting stent postdilated with a 3.5 mm balloon.  He did have moderate circumflex and marked marginal branch disease.  His EF initially in the Cath Lab was 35% over the next day his EF by echo was 60% with an inferior wall motion abnormality.  He was on aspirin and Brilinta and currently is on aspirin 11.  He denies chest pain or shortness of breath.  Tobacco abuse Continued tobacco use at the time of his STEMI  Mixed hyperlipidemia History of hyperlipidemia on high-dose atorvastatin and Zetia with lipid profile performed by his PCP 1 month ago revealing a total cholesterol 96, LDL 49 and HDL of 30.  Runell Gess MD FACP,FACC,FAHA, [facility] 01/15/2024 9:35 AM

## 2024-01-15 NOTE — Assessment & Plan Note (Signed)
 History of hyperlipidemia on high-dose atorvastatin and Zetia with lipid profile performed by his PCP 1 month ago revealing a total cholesterol 96, LDL 49 and HDL of 30.

## 2024-02-14 ENCOUNTER — Other Ambulatory Visit: Payer: Self-pay | Admitting: Cardiovascular Disease

## 2024-05-06 ENCOUNTER — Other Ambulatory Visit: Payer: Self-pay | Admitting: Cardiovascular Disease

## 2024-05-09 ENCOUNTER — Telehealth: Payer: Self-pay | Admitting: Cardiovascular Disease

## 2024-05-09 MED ORDER — VASCEPA 1 G PO CAPS: 2.0000 g | ORAL_CAPSULE | Freq: Two times a day (BID) | ORAL | 2 refills | Status: DC

## 2024-05-09 NOTE — Telephone Encounter (Signed)
*  STAT* If patient is at the pharmacy, call can be transferred to refill team.   1. Which medications need to be refilled? (please list name of each medication and dose if known)   VASCEPA  1 g capsule   Take 2 capsules (2 g total) by mouth 2 (two) times daily.      2. Would you like to learn more about the convenience, safety, & potential cost savings by using the [facility]? No   3. Are you open to using the [facility] No   4. Which pharmacy/location (including street and city if local pharmacy) is medication to be sent to?[facility] - [place], [place] - [address]    5. Do they need a 30 day or 90 day supply?  90 Day supply

## 2024-05-09 NOTE — Telephone Encounter (Signed)
 Pt's medication was sent to pt's pharmacy as requested. Confirmation received.

## 2024-05-11 ENCOUNTER — Other Ambulatory Visit: Payer: Self-pay | Admitting: Cardiovascular Disease

## 2024-06-21 ENCOUNTER — Telehealth: Payer: Self-pay | Admitting: Cardiovascular Disease

## 2024-06-21 NOTE — Telephone Encounter (Signed)
   Pre-operative Risk Assessment    Patient Name: Bruce Mcgee  DOB: 19-Apr-1956 MRN: [id number]   Date of last office visit: 01/15/24 Date of next office visit: TBD   Request for Surgical Clearance    Procedure:  Bronchoscopy biopsy of lung mass  Date of Surgery:  Clearance 07/07/24                                Surgeon:  Dr. Karena Surgeon's Group or Practice Name:  [facility] Phone number:  [phone] Fax number:  [phone]   Type of Clearance Requested:   - Medical  - Pharmacy:  Hold Ticagrelor  (Brilinta ) 5 days   Type of Anesthesia:  General    Additional requests/questions:    Bonney Lauraine KANDICE Georgina   06/21/2024, 2:25 PM

## 2024-06-22 NOTE — Telephone Encounter (Signed)
   Name: Bruce Mcgee  DOB: 1956/02/16  MRN: [id number]  Primary Cardiologist: Dorn Lesches, MD   Preoperative team, please contact this patient and set up a phone call appointment for further preoperative risk assessment. Please obtain consent and complete medication review. Thank you for your help.  I confirm that guidance regarding antiplatelet and oral anticoagulation therapy has been completed and, if necessary, noted below.  Per office protocol, if patient is without any new symptoms or concerns at the time of their virtual visit, he may hold Brilinta  for 5 days prior to procedure. Please resume Brilinta  as soon as possible postprocedure, at the discretion of the surgeon.    I also confirmed the patient resides in the state of [place] . As per [facility] telemedicine laws, the patient must reside in the state in which the provider is licensed.   Lamarr Satterfield, NP 06/22/2024, 8:30 AM [facility]

## 2024-06-22 NOTE — Telephone Encounter (Signed)
 LVM askig pt to call our office to schedule VV for preop clearance. Please reach out to preop team if pt returns call. Thank you.

## 2024-06-23 ENCOUNTER — Telehealth: Payer: Self-pay | Admitting: *Deleted

## 2024-06-23 NOTE — Telephone Encounter (Signed)
 Patient was returning call. Please advise ?

## 2024-06-23 NOTE — Telephone Encounter (Signed)
 Pt has been scheduled tele preop appt 06/29/24. Med rec and consent are done.      Patient Consent for Virtual Visit        Bruce Mcgee has provided verbal consent on 06/23/2024 for a virtual visit (video or telephone).   CONSENT FOR VIRTUAL VISIT FOR:  Bruce Mcgee  By participating in this virtual visit I agree to the following:  I hereby voluntarily request, consent and authorize [facility] and its employed or contracted physicians, physician assistants, nurse practitioners or other licensed health care professionals (the Practitioner), to provide me with telemedicine health care services (the "Services) as deemed necessary by the treating Practitioner. I acknowledge and consent to receive the Services by the Practitioner via telemedicine. I understand that the telemedicine visit will involve communicating with the Practitioner through live audiovisual communication technology and the disclosure of certain medical information by electronic transmission. I acknowledge that I have been given the opportunity to request an in-person assessment or other available alternative prior to the telemedicine visit and am voluntarily participating in the telemedicine visit.  I understand that I have the right to withhold or withdraw my consent to the use of telemedicine in the course of my care at any time, without affecting my right to future care or treatment, and that the Practitioner or I may terminate the telemedicine visit at any time. I understand that I have the right to inspect all information obtained and/or recorded in the course of the telemedicine visit and may receive copies of available information for a reasonable fee.  I understand that some of the potential risks of receiving the Services via telemedicine include:  Delay or interruption in medical evaluation due to technological equipment failure or disruption; Information transmitted may not be sufficient (e.g. poor  resolution of images) to allow for appropriate medical decision making by the Practitioner; and/or  In rare instances, security protocols could fail, causing a breach of personal health information.  Furthermore, I acknowledge that it is my responsibility to provide information about my medical history, conditions and care that is complete and accurate to the best of my ability. I acknowledge that Practitioner's advice, recommendations, and/or decision may be based on factors not within their control, such as incomplete or inaccurate data provided by me or distortions of diagnostic images or specimens that may result from electronic transmissions. I understand that the practice of medicine is not an exact science and that Practitioner makes no warranties or guarantees regarding treatment outcomes. I acknowledge that a copy of this consent can be made available to me via my patient portal [facility]), or I can request a printed copy by calling the office of [facility].    I understand that my insurance will be billed for this visit.   I have read or had this consent read to me. I understand the contents of this consent, which adequately explains the benefits and risks of the Services being provided via telemedicine.  I have been provided ample opportunity to ask questions regarding this consent and the Services and have had my questions answered to my satisfaction. I give my informed consent for the services to be provided through the use of telemedicine in my medical care

## 2024-06-23 NOTE — Telephone Encounter (Signed)
 Pt has been scheduled tele preop appt 06/29/24. Med rec and consent are done.

## 2024-06-29 ENCOUNTER — Ambulatory Visit: Attending: Cardiology | Admitting: Emergency Medicine

## 2024-06-29 DIAGNOSIS — Z01810 Encounter for preprocedural cardiovascular examination: Secondary | ICD-10-CM

## 2024-06-29 NOTE — Progress Notes (Signed)
 Virtual Visit via Telephone Note   Because of Bruce Mcgee co-morbid illnesses, he is at least at moderate risk for complications without adequate follow up.  This format is felt to be most appropriate for this patient at this time.  Due to technical limitations with video connection (technology), today's appointment will be conducted as an audio only telehealth visit, and RUFORD DUDZINSKI verbally agreed to proceed in this manner.   All issues noted in this document were discussed and addressed.  No physical exam could be performed with this format.  Evaluation Performed:  Preoperative cardiovascular risk assessment _____________   Date:  06/29/2024   Patient ID:  Bruce Mcgee, DOB 01/10/1956, MRN [id number] Patient Location:  Home Provider location:   Office  Primary Care Provider:  Debrah Josette ORN., PA-C Primary Cardiologist:  Dorn Lesches, MD  Chief Complaint / Patient Profile   68 y.o. y/o male with a h/o coronary artery disease, STEMI s/p DES to RCA in 2023, tobacco abuse, hyperlipidemia who is pending bronchoscopy biopsy of lung mass on 07/07/2024 with [facility] pul[facility] and presents today for telephonic preoperative cardiovascular risk assessment.  History of Present Illness    Bruce Mcgee is a 68 y.o. male who presents via audio/video conferencing for a telehealth visit today.  Pt was last seen in cardiology clinic on 01/15/2024 by Lesches.  At that time Jenene LITTIE Somerset was doing well .  The patient is now pending procedure as outlined above. Since his last visit, he denies chest pain, lower extremity edema, fatigue, palpitations, melena, hematuria, hemoptysis, diaphoresis, weakness, presyncope, syncope, and PND.  Today patient is doing well overall.  He is without any new acute cardiovascular concerns or complaints at this time.  He tells me he has had some shortness of breath and has recently been found to have a lung mass.  Shortness of breath is more constant at  rest and on exertion.  He denies any chest pains or exertional angina.  He tells me he is retired however he still continues to do yard work, walk, and has recently built a deck.  He does not experience any chest pains while exerting himself and he denies his prior anginal equivalent.  Overall no further ischemic testing is warranted.  He is able to complete greater than 4 METS.  Past Medical History    Past Medical History:  Diagnosis Date   Acute ST elevation myocardial infarction (STEMI) of inferior wall (HCC) 03/02/2022   CAD (coronary artery disease)    Headache    History of kidney stones    Hyperlipidemia    Hypothyroidism    Tobacco abuse    Past Surgical History:  Procedure Laterality Date   APPENDECTOMY     CORONARY/GRAFT ACUTE MI REVASCULARIZATION N/A 03/03/2022   Procedure: Coronary/Graft Acute MI Revascularization;  Surgeon: Lesches Dorn PARAS, MD;  Location: [facility] INVASIVE CV LAB;  Service: Cardiovascular;  Laterality: N/A;   INCISIONAL HERNIA REPAIR N/A 03/18/2018   Procedure: INCISIONAL HERNIA REPAIR,  RECTRARECTAL TRANSVERSUS ABDOMINAL RELEASE;  Surgeon: Kinsinger, Herlene Righter, MD;  Location: [facility] ORS;  Service: General;  Laterality: N/A;   INSERTION OF MESH N/A 03/18/2018   Procedure: INSERTION OF MESH;  Surgeon: Stevie Herlene Righter, MD;  Location: [facility] ORS;  Service: General;  Laterality: N/A;   LEFT HEART CATH AND CORONARY ANGIOGRAPHY N/A 03/03/2022   Procedure: LEFT HEART CATH AND CORONARY ANGIOGRAPHY;  Surgeon: Lesches Dorn PARAS, MD;  Location: [facility] INVASIVE CV LAB;  Service:  Cardiovascular;  Laterality: N/A;   LIPOMA EXCISION     on left shoulder    Allergies  Allergies  Allergen Reactions   Adhesive [Tape]     Pulls off skin    Latex     Blisters    Home Medications    Prior to Admission medications   Medication Sig Start Date End Date Taking? Authorizing Provider  acetaminophen  (TYLENOL ) 500 MG tablet Take 1,000 mg by mouth every 6 (six) hours as needed for mild  pain.    [provider]  albuterol  (PROVENTIL  HFA;VENTOLIN  HFA) 108 (90 Base) MCG/ACT inhaler Inhale 2 puffs into the lungs every 4 (four) hours as needed for wheezing or shortness of breath.  02/18/18   [provider]  aspirin  81 MG chewable tablet Chew 1 tablet (81 mg total) by mouth daily. 03/05/22   Vicci Rollo SAUNDERS, PA-C  atorvastatin  (LIPITOR ) 80 MG tablet Take 1 tablet (80 mg total) by mouth daily. 11/17/23   Court Dorn PARAS, MD  ezetimibe  (ZETIA ) 10 MG tablet Take 1 tablet (10 mg total) by mouth daily. 04/21/23 06/23/24  Court Dorn PARAS, MD  fluticasone (FLONASE) 50 MCG/ACT nasal spray Place 2 sprays into both nostrils as needed. 11/12/23 11/11/24  [provider]  furosemide (LASIX) 20 MG tablet Take 20 mg by mouth as needed. 10/08/22   [provider]  levothyroxine  (SYNTHROID ) 125 MCG tablet Take 1 tablet by mouth daily. 06/22/22   [provider]  metoprolol  tartrate (LOPRESSOR ) 25 MG tablet Take 1/2 (one-half) tablet by mouth twice daily 05/10/24   Court Dorn PARAS, MD  nicotine  (NICODERM CQ  - DOSED IN MG/24 HOURS) 21 mg/24hr patch Place 1 patch (21 mg total) onto the skin daily. 03/26/22   Goodrich, Callie E, PA-C  nitroGLYCERIN  (NITROSTAT ) 0.4 MG SL tablet Place 1 tablet (0.4 mg total) under the tongue every 5 (five) minutes as needed for chest pain. 11/17/23   Court Dorn PARAS, MD  omeprazole (PRILOSEC) 20 MG capsule Take 20 mg by mouth daily. 12/06/21   [provider]  ticagrelor  (BRILINTA ) 60 MG TABS tablet Take 1 tablet by mouth twice daily 05/11/24   Court Dorn PARAS, MD  VASCEPA  1 g capsule Take 2 capsules (2 g total) by mouth 2 (two) times daily. 05/09/24   Court Dorn PARAS, MD    Physical Exam    Vital Signs:  Jenene LITTIE Somerset does not have vital signs available for review today.  Given telephonic nature of communication, physical exam is limited. AAOx3. NAD. Normal affect.  Speech and respirations are unlabored.  Accessory  Clinical Findings    None  Assessment & Plan    1.  Preoperative Cardiovascular Risk Assessment: According to the Revised Cardiac Risk Index (RCRI), his Perioperative Risk of Major Cardiac Event is (%): 0.9. His Functional Capacity in METs is: 5.62 according to the Duke Activity Status Index (DASI). Therefore, based on ACC/AHA guidelines, patient would be at acceptable risk for the planned procedure without further cardiovascular testing.   The patient was advised that if he develops new symptoms prior to surgery to contact our office to arrange for a follow-up visit, and he verbalized understanding.  He may hold Brilinta  for 5 days prior to procedure. Please resume Brilinta  as soon as possible postprocedure, at the discretion of the surgeon.    A copy of this note will be routed to requesting surgeon.  Time:   Today, I have spent 10 minutes with the patient with telehealth technology  discussing medical history, symptoms, and management plan.     Lum LITTIE Louis, NP  06/29/2024, 2:21 PM

## 2024-07-03 ENCOUNTER — Emergency Department (HOSPITAL_COMMUNITY)

## 2024-07-03 ENCOUNTER — Other Ambulatory Visit: Payer: Self-pay

## 2024-07-03 ENCOUNTER — Ambulatory Visit
Admission: EM | Admit: 2024-07-03 | Discharge: 2024-07-03 | Disposition: A | Discharge status: Home / Self Care | Attending: Family Medicine | Admitting: Family Medicine

## 2024-07-03 ENCOUNTER — Encounter (HOSPITAL_COMMUNITY): Payer: Self-pay | Admitting: Internal Medicine

## 2024-07-03 ENCOUNTER — Inpatient Hospital Stay (HOSPITAL_COMMUNITY)
Admission: EM | Admit: 2024-07-03 | Discharge: 2024-07-05 | DRG: 189 | Disposition: A | Discharge status: Home / Self Care | Attending: Internal Medicine | Admitting: Internal Medicine

## 2024-07-03 DIAGNOSIS — Z951 Presence of aortocoronary bypass graft: Secondary | ICD-10-CM

## 2024-07-03 DIAGNOSIS — Z7982 Long term (current) use of aspirin: Secondary | ICD-10-CM | POA: Diagnosis not present

## 2024-07-03 DIAGNOSIS — Z720 Tobacco use: Secondary | ICD-10-CM | POA: Diagnosis not present

## 2024-07-03 DIAGNOSIS — Z79899 Other long term (current) drug therapy: Secondary | ICD-10-CM | POA: Diagnosis not present

## 2024-07-03 DIAGNOSIS — Z91048 Other nonmedicinal substance allergy status: Secondary | ICD-10-CM | POA: Diagnosis not present

## 2024-07-03 DIAGNOSIS — I2510 Atherosclerotic heart disease of native coronary artery without angina pectoris: Secondary | ICD-10-CM | POA: Diagnosis present

## 2024-07-03 DIAGNOSIS — F17210 Nicotine dependence, cigarettes, uncomplicated: Secondary | ICD-10-CM | POA: Diagnosis present

## 2024-07-03 DIAGNOSIS — Z91040 Latex allergy status: Secondary | ICD-10-CM | POA: Diagnosis not present

## 2024-07-03 DIAGNOSIS — Z20822 Contact with and (suspected) exposure to covid-19: Secondary | ICD-10-CM

## 2024-07-03 DIAGNOSIS — E039 Hypothyroidism, unspecified: Secondary | ICD-10-CM | POA: Diagnosis present

## 2024-07-03 DIAGNOSIS — J441 Chronic obstructive pulmonary disease with (acute) exacerbation: Secondary | ICD-10-CM | POA: Diagnosis present

## 2024-07-03 DIAGNOSIS — Z79890 Hormone replacement therapy (postmenopausal): Secondary | ICD-10-CM

## 2024-07-03 DIAGNOSIS — Z9049 Acquired absence of other specified parts of digestive tract: Secondary | ICD-10-CM

## 2024-07-03 DIAGNOSIS — Z1152 Encounter for screening for COVID-19: Secondary | ICD-10-CM

## 2024-07-03 DIAGNOSIS — E782 Mixed hyperlipidemia: Secondary | ICD-10-CM

## 2024-07-03 DIAGNOSIS — J9601 Acute respiratory failure with hypoxia: Principal | ICD-10-CM

## 2024-07-03 DIAGNOSIS — Z87442 Personal history of urinary calculi: Secondary | ICD-10-CM | POA: Diagnosis not present

## 2024-07-03 DIAGNOSIS — Z7902 Long term (current) use of antithrombotics/antiplatelets: Secondary | ICD-10-CM | POA: Diagnosis not present

## 2024-07-03 DIAGNOSIS — I252 Old myocardial infarction: Secondary | ICD-10-CM | POA: Diagnosis not present

## 2024-07-03 DIAGNOSIS — E876 Hypokalemia: Secondary | ICD-10-CM | POA: Diagnosis present

## 2024-07-03 DIAGNOSIS — J22 Unspecified acute lower respiratory infection: Secondary | ICD-10-CM

## 2024-07-03 LAB — HEPATIC FUNCTION PANEL
ALT: 24 U/L (ref 0–44)
AST: 22 U/L (ref 15–41)
Albumin: 3.2 g/dL — ABNORMAL LOW (ref 3.5–5.0)
Alkaline Phosphatase: 125 U/L (ref 38–126)
Bilirubin, Direct: 0.1 mg/dL (ref 0.0–0.2)
Indirect Bilirubin: 0.5 mg/dL (ref 0.3–0.9)
Total Bilirubin: 0.6 mg/dL (ref 0.0–1.2)
Total Protein: 6.7 g/dL (ref 6.5–8.1)

## 2024-07-03 LAB — BASIC METABOLIC PANEL WITH GFR
Anion gap: 11 (ref 5–15)
BUN: 13 mg/dL (ref 8–23)
CO2: 27 mmol/L (ref 22–32)
Calcium: 8.3 mg/dL — ABNORMAL LOW (ref 8.9–10.3)
Chloride: 102 mmol/L (ref 98–111)
Creatinine, Ser: 0.85 mg/dL (ref 0.61–1.24)
GFR, Estimated: >60 mL/min (ref >60)
Glucose, Bld: 102 mg/dL — ABNORMAL HIGH (ref 70–99)
Potassium: 3.4 mmol/L — ABNORMAL LOW (ref 3.5–5.1)
Sodium: 140 mmol/L (ref 135–145)

## 2024-07-03 LAB — RESPIRATORY PANEL BY PCR

## 2024-07-03 LAB — CBC
HCT: 43.5 % (ref 39.0–52.0)
Hemoglobin: 14 g/dL (ref 13.0–17.0)
MCH: 28.7 pg (ref 26.0–34.0)
MCHC: 32.2 g/dL (ref 30.0–36.0)
MCV: 89.3 fL (ref 80.0–100.0)
Platelets: 302 K/uL (ref 150–400)
RBC: 4.87 MIL/uL (ref 4.22–5.81)
RDW: 14.6 % (ref 11.5–15.5)
WBC: 10.2 K/uL (ref 4.0–10.5)
nRBC: 0 % (ref 0.0–0.2)

## 2024-07-03 LAB — RESP PANEL BY RT-PCR (RSV, FLU A&B, COVID)  RVPGX2
Influenza A by PCR: NEGATIVE
Influenza B by PCR: NEGATIVE
Resp Syncytial Virus by PCR: NEGATIVE
SARS Coronavirus 2 by RT PCR: NEGATIVE

## 2024-07-03 LAB — PROCALCITONIN: Procalcitonin: <0.1 ng/mL

## 2024-07-03 LAB — BRAIN NATRIURETIC PEPTIDE: B Natriuretic Peptide: 18 pg/mL (ref 0.0–100.0)

## 2024-07-03 LAB — TROPONIN I (HIGH SENSITIVITY): Troponin I (High Sensitivity): 10 ng/L (ref <18)

## 2024-07-03 LAB — POC SOFIA SARS ANTIGEN FIA: SARS Coronavirus 2 Ag: NEGATIVE

## 2024-07-03 LAB — HIV ANTIBODY (ROUTINE TESTING W REFLEX): HIV Screen 4th Generation wRfx: NONREACTIVE

## 2024-07-03 MED ORDER — IPRATROPIUM-ALBUTEROL 0.5-2.5 (3) MG/3ML IN SOLN
3.0000 mL | Freq: Four times a day (QID) | RESPIRATORY_TRACT | Status: DC
  Administered 2024-07-03: 3 mL via RESPIRATORY_TRACT
  Filled 2024-07-03: qty 3

## 2024-07-03 MED ORDER — ICOSAPENT ETHYL 1 G PO CAPS
2.0000 g | ORAL_CAPSULE | Freq: Two times a day (BID) | ORAL | Status: DC
  Administered 2024-07-03 – 2024-07-05 (×4): 2 g via ORAL
  Filled 2024-07-03 (×4): qty 2

## 2024-07-03 MED ORDER — ACETAMINOPHEN 325 MG PO TABS
650.0000 mg | ORAL_TABLET | Freq: Four times a day (QID) | ORAL | Status: DC | PRN
Start: 2024-07-03 — End: 2024-07-05

## 2024-07-03 MED ORDER — BUDESONIDE 0.5 MG/2ML IN SUSP
0.5000 mg | Freq: Two times a day (BID) | RESPIRATORY_TRACT | Status: DC
  Administered 2024-07-03 – 2024-07-05 (×4): 0.5 mg via RESPIRATORY_TRACT
  Filled 2024-07-03 (×4): qty 2

## 2024-07-03 MED ORDER — SODIUM CHLORIDE 0.9 % IV SOLN
1.0000 g | INTRAVENOUS | Status: DC
  Administered 2024-07-03: 1 g via INTRAVENOUS
  Filled 2024-07-03: qty 10

## 2024-07-03 MED ORDER — METOPROLOL TARTRATE 25 MG PO TABS
12.5000 mg | ORAL_TABLET | Freq: Two times a day (BID) | ORAL | Status: DC
  Administered 2024-07-03 – 2024-07-05 (×4): 12.5 mg via ORAL
  Filled 2024-07-03 (×4): qty 1

## 2024-07-03 MED ORDER — POTASSIUM CHLORIDE CRYS ER 20 MEQ PO TBCR
40.0000 meq | EXTENDED_RELEASE_TABLET | Freq: Once | ORAL | Status: AC
  Administered 2024-07-03: 40 meq via ORAL
  Filled 2024-07-03: qty 2

## 2024-07-03 MED ORDER — ENOXAPARIN SODIUM 40 MG/0.4ML IJ SOSY
40.0000 mg | PREFILLED_SYRINGE | INTRAMUSCULAR | Status: DC
  Administered 2024-07-03 – 2024-07-04 (×2): 40 mg via SUBCUTANEOUS
  Filled 2024-07-03 (×2): qty 0.4

## 2024-07-03 MED ORDER — GUAIFENESIN ER 600 MG PO TB12
1200.0000 mg | ORAL_TABLET | Freq: Two times a day (BID) | ORAL | Status: DC
  Administered 2024-07-03 – 2024-07-05 (×4): 1200 mg via ORAL
  Filled 2024-07-03 (×4): qty 2

## 2024-07-03 MED ORDER — METHYLPREDNISOLONE SODIUM SUCC 125 MG IJ SOLR
60.0000 mg | Freq: Two times a day (BID) | INTRAMUSCULAR | Status: DC
  Administered 2024-07-03 – 2024-07-05 (×4): 60 mg via INTRAVENOUS
  Filled 2024-07-03 (×4): qty 2

## 2024-07-03 MED ORDER — ALBUTEROL SULFATE (2.5 MG/3ML) 0.083% IN NEBU
5.0000 mg | INHALATION_SOLUTION | Freq: Once | RESPIRATORY_TRACT | Status: AC
  Administered 2024-07-03: 5 mg via RESPIRATORY_TRACT
  Filled 2024-07-03: qty 6

## 2024-07-03 MED ORDER — ALBUTEROL SULFATE (2.5 MG/3ML) 0.083% IN NEBU
2.5000 mg | INHALATION_SOLUTION | Freq: Once | RESPIRATORY_TRACT | Status: AC
  Administered 2024-07-03: 2.5 mg via RESPIRATORY_TRACT

## 2024-07-03 MED ORDER — PANTOPRAZOLE SODIUM 40 MG PO TBEC
40.0000 mg | DELAYED_RELEASE_TABLET | Freq: Every day | ORAL | Status: DC
  Administered 2024-07-04 – 2024-07-05 (×2): 40 mg via ORAL
  Filled 2024-07-03 (×2): qty 1

## 2024-07-03 MED ORDER — POTASSIUM CHLORIDE CRYS ER 20 MEQ PO TBCR
20.0000 meq | EXTENDED_RELEASE_TABLET | Freq: Once | ORAL | Status: AC
  Administered 2024-07-03: 20 meq via ORAL
  Filled 2024-07-03: qty 1

## 2024-07-03 MED ORDER — ONDANSETRON HCL 4 MG PO TABS: 4.0000 mg | ORAL_TABLET | Freq: Four times a day (QID) | ORAL | Status: DC | PRN

## 2024-07-03 MED ORDER — ONDANSETRON HCL 4 MG/2ML IJ SOLN: 4.0000 mg | Freq: Four times a day (QID) | INTRAMUSCULAR | Status: DC | PRN

## 2024-07-03 MED ORDER — ARFORMOTEROL TARTRATE 15 MCG/2ML IN NEBU
15.0000 ug | INHALATION_SOLUTION | Freq: Two times a day (BID) | RESPIRATORY_TRACT | Status: DC
  Administered 2024-07-03 – 2024-07-05 (×4): 15 ug via RESPIRATORY_TRACT
  Filled 2024-07-03 (×4): qty 2

## 2024-07-03 MED ORDER — DEXAMETHASONE SODIUM PHOSPHATE 10 MG/ML IJ SOLN
8.0000 mg | Freq: Once | INTRAMUSCULAR | Status: AC
  Administered 2024-07-03: 8 mg via INTRAVENOUS
  Filled 2024-07-03: qty 1

## 2024-07-03 MED ORDER — ACETAMINOPHEN 650 MG RE SUPP: 650.0000 mg | Freq: Four times a day (QID) | RECTAL | Status: DC | PRN

## 2024-07-03 MED ORDER — ATORVASTATIN CALCIUM 40 MG PO TABS
80.0000 mg | ORAL_TABLET | Freq: Every day | ORAL | Status: DC
  Administered 2024-07-04 – 2024-07-05 (×2): 80 mg via ORAL
  Filled 2024-07-03 (×2): qty 2

## 2024-07-03 MED ORDER — ASPIRIN 81 MG PO CHEW
81.0000 mg | CHEWABLE_TABLET | Freq: Every day | ORAL | Status: DC
  Administered 2024-07-04 – 2024-07-05 (×2): 81 mg via ORAL
  Filled 2024-07-03 (×2): qty 1

## 2024-07-03 MED ORDER — LEVOTHYROXINE SODIUM 125 MCG PO TABS
125.0000 ug | ORAL_TABLET | Freq: Every day | ORAL | Status: DC
  Administered 2024-07-04 – 2024-07-05 (×2): 125 ug via ORAL
  Filled 2024-07-03 (×2): qty 1

## 2024-07-03 MED ORDER — IPRATROPIUM-ALBUTEROL 0.5-2.5 (3) MG/3ML IN SOLN
3.0000 mL | Freq: Once | RESPIRATORY_TRACT | Status: AC
  Administered 2024-07-03: 3 mL via RESPIRATORY_TRACT
  Filled 2024-07-03: qty 3

## 2024-07-03 MED ORDER — GUAIFENESIN-DM 100-10 MG/5ML PO SYRP
5.0000 mL | ORAL_SOLUTION | ORAL | Status: DC | PRN
  Administered 2024-07-03: 5 mL via ORAL
  Filled 2024-07-03: qty 5

## 2024-07-03 MED ORDER — EZETIMIBE 10 MG PO TABS
10.0000 mg | ORAL_TABLET | Freq: Every day | ORAL | Status: DC
  Administered 2024-07-04 – 2024-07-05 (×2): 10 mg via ORAL
  Filled 2024-07-03 (×2): qty 1

## 2024-07-03 MED ORDER — NICOTINE 21 MG/24HR TD PT24: 21.0000 mg | MEDICATED_PATCH | Freq: Every day | TRANSDERMAL | Status: DC

## 2024-07-03 MED ORDER — FLUTICASONE PROPIONATE 50 MCG/ACT NA SUSP: 2.0000 | Freq: Every day | NASAL | Status: DC | PRN

## 2024-07-03 MED ORDER — AZITHROMYCIN 250 MG PO TABS
500.0000 mg | ORAL_TABLET | Freq: Every day | ORAL | Status: DC
  Administered 2024-07-03 – 2024-07-05 (×3): 500 mg via ORAL
  Filled 2024-07-03 (×3): qty 2

## 2024-07-03 NOTE — Hospital Course (Signed)
 68 year old male with a history of coronary artery disease status post CABG 02/2022, hyperlipidemia, GERD, hypothyroidism, and right upper lung mass presenting with at least 2-day history of shortness of breath, fatigue, myalgias, and worsening nonproductive cough.  The patient denies any fevers or chills, headache, neck pain, chest pain, hemoptysis, nausea, vomiting, diarrhea.  He has had some abdominal discomfort from the coughing.  He denies any hematochezia, melena, dysuria, hematuria. He continues to smoke about 2 to 3 cigarettes/day.  At his heaviest, the patient was smoking 3 packs/day since the age of 69. He went to urgent care on the morning of 07/03/2024.  He was noted to be hypoxic with oxygen  saturation 85% on room air.  He was placed on 2 L and sent to the emergency department for further evaluation and treatment.  Notably, the patient states that his wife was tested COVID-positive at urgent care on the day of admission. In the ED, the patient was afebrile hemodynamically stable with oxygen  saturation 92% on 2 L.  WBC 10.2, hemoglobin 14.0, platelets 302.  Sodium 140, potassium 3.4, bicarbonate 27, serum creatinine 0.85.  Chest x-ray showed mild diffuse interstitial markings.  The patient was given bronchodilators and dexamethasone  in the ED.  He was admitted for further evaluation and treatment of his respiratory failure.

## 2024-07-03 NOTE — ED Provider Notes (Signed)
 [facility]    CSN: [id number] Arrival date & time: 07/03/24  1123      History   Chief Complaint No chief complaint on file.   HPI Bruce Mcgee is a 68 y.o. male.   Patient presenting today with about a week of progressively worsening productive cough, headache, body aches, fatigue, shortness of breath, wheezing.  Denies fever, chest pain, abdominal pain, nausea vomiting or diarrhea.  So far trying over-the-counter cold and congestion medication with minimal relief.  Past medical history significant for history of STEMI, history of cigarette smoking, and being followed currently by pulmonology awaiting procedure later this week for further evaluation of multiple pulmonary nodules.  Baseline O2 saturations on room air per patient are around 94%.  Has an albuterol  inhaler for as needed use, states it is not helping.  Wife has been sick with same symptoms.    Past Medical History:  Diagnosis Date   Acute ST elevation myocardial infarction (STEMI) of inferior wall (HCC) 03/02/2022   CAD (coronary artery disease)    Headache    History of kidney stones    Hyperlipidemia    Hypothyroidism    Tobacco abuse     Patient Active Problem List   Diagnosis Date Noted   CAD (coronary artery disease) 03/25/2022   Hypothyroidism 03/04/2022   Tobacco abuse 03/04/2022   Mixed hyperlipidemia 03/04/2022   History of ST elevation myocardial infarction (STEMI) 03/02/2022   Incisional hernia 03/08/2018   Abdominal wall hernia     Past Surgical History:  Procedure Laterality Date   APPENDECTOMY     CORONARY/GRAFT ACUTE MI REVASCULARIZATION N/A 03/03/2022   Procedure: Coronary/Graft Acute MI Revascularization;  Surgeon: Court Dorn PARAS, MD;  Location: [facility];  Service: Cardiovascular;  Laterality: N/A;   INCISIONAL HERNIA REPAIR N/A 03/18/2018   Procedure: INCISIONAL HERNIA REPAIR,  RECTRARECTAL TRANSVERSUS ABDOMINAL RELEASE;  Surgeon: Kinsinger, Herlene Righter, MD;   Location: [facility];  Service: General;  Laterality: N/A;   INSERTION OF MESH N/A 03/18/2018   Procedure: INSERTION OF MESH;  Surgeon: Stevie Herlene Righter, MD;  Location: [facility];  Service: General;  Laterality: N/A;   LEFT HEART CATH AND CORONARY ANGIOGRAPHY N/A 03/03/2022   Procedure: LEFT HEART CATH AND CORONARY ANGIOGRAPHY;  Surgeon: Court Dorn PARAS, MD;  Location: [facility];  Service: Cardiovascular;  Laterality: N/A;   LIPOMA EXCISION     on left shoulder       Home Medications    Prior to Admission medications   Medication Sig Start Date End Date Taking? Authorizing Provider  acetaminophen  (TYLENOL ) 500 MG tablet Take 1,000 mg by mouth every 6 (six) hours as needed for mild pain.    [provider]  albuterol  (PROVENTIL  HFA;VENTOLIN  HFA) 108 (90 Base) MCG/ACT inhaler Inhale 2 puffs into the lungs every 4 (four) hours as needed for wheezing or shortness of breath.  02/18/18   [provider]  aspirin  81 MG chewable tablet Chew 1 tablet (81 mg total) by mouth daily. 03/05/22   Vicci Rollo SAUNDERS, PA-C  atorvastatin  (LIPITOR ) 80 MG tablet Take 1 tablet (80 mg total) by mouth daily. 11/17/23   Court Dorn PARAS, MD  ezetimibe  (ZETIA ) 10 MG tablet Take 1 tablet (10 mg total) by mouth daily. 04/21/23 06/23/24  Court Dorn PARAS, MD  fluticasone  (FLONASE ) 50 MCG/ACT nasal spray Place 2 sprays into both nostrils as needed. 11/12/23 11/11/24  [provider]  furosemide (LASIX) 20 MG tablet Take 20  mg by mouth as needed. 10/08/22   [provider]  levothyroxine  (SYNTHROID ) 125 MCG tablet Take 1 tablet by mouth daily. 06/22/22   [provider]  metoprolol  tartrate (LOPRESSOR ) 25 MG tablet Take 1/2 (one-half) tablet by mouth twice daily 05/10/24   Court Dorn PARAS, MD  nicotine  (NICODERM CQ  - DOSED IN MG/24 HOURS) 21 mg/24hr patch Place 1 patch (21 mg total) onto the skin daily. 03/26/22   Goodrich, Callie E, PA-C  nitroGLYCERIN  (NITROSTAT ) 0.4 MG SL  tablet Place 1 tablet (0.4 mg total) under the tongue every 5 (five) minutes as needed for chest pain. 11/17/23   Court Dorn PARAS, MD  omeprazole (PRILOSEC) 20 MG capsule Take 20 mg by mouth daily. 12/06/21   [provider]  ticagrelor  (BRILINTA ) 60 MG TABS tablet Take 1 tablet by mouth twice daily 05/11/24   Court Dorn PARAS, MD  VASCEPA  1 g capsule Take 2 capsules (2 g total) by mouth 2 (two) times daily. 05/09/24   Court Dorn PARAS, MD    Family History History reviewed. No pertinent family history.  Social History Social History   Tobacco Use   Smoking status: Former    Current packs/day: 2.00    Types: Cigarettes   Smokeless tobacco: Never   Tobacco comments:    04/03/2022 Patient states he quit smoking  Vaping Use   Vaping status: Never Used  Substance Use Topics   Alcohol use: No   Drug use: No     Allergies   Adhesive [tape] and Latex   Review of Systems Review of Systems Per HPI  Physical Exam Triage Vital Signs ED Triage Vitals  Encounter Vitals Group     BP 07/03/24 1158 126/77     Girls Systolic BP Percentile --      Girls Diastolic BP Percentile --      Boys Systolic BP Percentile --      Boys Diastolic BP Percentile --      Pulse Rate 07/03/24 1158 90     Resp 07/03/24 1158 (!) 22     Temp 07/03/24 1158 98.1 F (36.7 C)     Temp Source 07/03/24 1158 Oral     SpO2 07/03/24 1158 (!) 85 %     Weight --      Height --      Head Circumference --      Peak Flow --      Pain Score 07/03/24 1201 0     Pain Loc --      Pain Education --      Exclude from Growth Chart --    No data found.  Updated Vital Signs BP 126/77   Pulse 90   Temp 98.1 F (36.7 C) (Oral)   Resp (!) 22   SpO2 93% Comment: room air post breathing tx.  Visual Acuity Right Eye Distance:   Left Eye Distance:   Bilateral Distance:    Right Eye Near:   Left Eye Near:    Bilateral Near:     Physical Exam Vitals and nursing note reviewed.  Constitutional:       Appearance: He is well-developed.  HENT:     Head: Atraumatic.     Right Ear: External ear normal.     Left Ear: External ear normal.     Nose: Congestion present.     Mouth/Throat:     Pharynx: Posterior oropharyngeal erythema present. No oropharyngeal exudate.  Eyes:     Conjunctiva/sclera: Conjunctivae normal.  Pupils: Pupils are equal, round, and reactive to light.  Cardiovascular:     Rate and Rhythm: Normal rate and regular rhythm.  Pulmonary:     Effort: Respiratory distress present.     Breath sounds: Wheezing present.     Comments: Mild tachypnea, mild increased labored breathing, breath sounds decreased throughout, diffuse wheezes Musculoskeletal:        General: Normal range of motion.     Cervical back: Normal range of motion and neck supple.  Lymphadenopathy:     Cervical: No cervical adenopathy.  Skin:    General: Skin is warm and dry.  Neurological:     Mental Status: He is alert and oriented to person, place, and time.  Psychiatric:        Behavior: Behavior normal.      UC Treatments / Results  Labs (all labs ordered are listed, but only abnormal results are displayed) Labs Reviewed  POC SOFIA SARS ANTIGEN FIA    EKG   Radiology No results found.  Procedures Procedures (including critical care time)  Medications Ordered in UC Medications  albuterol  (PROVENTIL ) (2.5 MG/3ML) 0.083% nebulizer solution 2.5 mg (2.5 mg Nebulization Given 07/03/24 1212)    Initial Impression / Assessment and Plan / UC Course  I have reviewed the triage vital signs and the nursing notes.  Pertinent labs & imaging results that were available during my care of the patient were reviewed by me and considered in my medical decision making (see chart for details).     Oxygen  saturation 85% on room air in triage, only improved to 88% on 2 L so was increased to 4 L temporarily where he was able to remain at 93% on room air.  He is also tachypneic but otherwise  well-appearing, alert and oriented.  Wife who is presenting with him today tested positive for COVID, however he did not.  Do suspect that was the initial cause of his respiratory symptoms.  Likely developing into a pneumonia at this time but given his unstable vital signs particularly his hypoxia do not feel it is safe to discharge home.  He declines EMS transport to the emergency department wishes for his significant other to take him via private vehicle.  Both are agreeable to this plan.  He was given albuterol  nebulizer treatment prior to discharge to the emergency department.  Final Clinical Impressions(s) / UC Diagnoses   Final diagnoses:  Lower respiratory infection  Exposure to COVID-19 virus  Acute respiratory failure with hypoxia [facility])     Discharge Instructions      Go directly to the emergency department for further evaluation given your very low oxygen  levels.    ED Prescriptions   None    PDMP not reviewed this encounter.   Stuart Vernell Norris, PA-C 07/03/24 1227

## 2024-07-03 NOTE — Plan of Care (Signed)
  Problem: Health Behavior/Discharge Planning: Goal: Ability to manage health-related needs will improve Outcome: Progressing   Problem: Clinical Measurements: Goal: Ability to maintain clinical measurements within normal limits will improve Outcome: Progressing Goal: Respiratory complications will improve Outcome: Progressing   Problem: Activity: Goal: Risk for activity intolerance will decrease Outcome: Progressing   

## 2024-07-03 NOTE — ED Triage Notes (Addendum)
 Pt repots cough, headache, body aches, loss of sleep due to the cough, SOB, occasional difficulty breathing x1 week . Pt has tried OTC meds butr has found no relief.

## 2024-07-03 NOTE — ED Notes (Signed)
 X-ray at bedside.

## 2024-07-03 NOTE — ED Notes (Addendum)
 Patient is being discharged from the Urgent Care and sent to the Emergency Department via POV . Per PA, patient is in need of higher level of care due to hypoxia. Patient is aware and verbalizes understanding of plan of care.  Vitals:   07/03/24 1158  BP: 126/77  Pulse: 90  Resp: (!) 22  Temp: 98.1 F (36.7 C)  SpO2: (!) 85%   Pt spouse is driving pt to ED.

## 2024-07-03 NOTE — ED Provider Notes (Signed)
  EMERGENCY DEPARTMENT AT [facility] Provider Note   CSN: [id number] Arrival date & time: 07/03/24  1232     Patient presents with: Shortness of Breath   Bruce Mcgee is a 68 y.o. male.   Patient is a 68 year old male who presents to the emergency department with a chief complaint of cough, congestion, intermittent fevers, headache which has been ongoing for approximate the past week.  He notes his wife is positive for COVID-19.  He notes that he has tested at home but has been negative.  He was evaluated in urgent care and noted to have O2 saturations at approximately 85% on room air.  Patient notes that he does not wear oxygen  at home but does use a nebulizer.  He did have a bronchoscopy performed 3 days ago.  Patient denies any associated chest pain.  He notes he has had no abdominal pain, nausea, vomiting, diarrhea.   Shortness of Breath      Prior to Admission medications   Medication Sig Start Date End Date Taking? Authorizing Provider  acetaminophen  (TYLENOL ) 500 MG tablet Take 1,000 mg by mouth every 6 (six) hours as needed for mild pain.    [provider]  albuterol  (PROVENTIL  HFA;VENTOLIN  HFA) 108 (90 Base) MCG/ACT inhaler Inhale 2 puffs into the lungs every 4 (four) hours as needed for wheezing or shortness of breath.  02/18/18   [provider]  aspirin  81 MG chewable tablet Chew 1 tablet (81 mg total) by mouth daily. 03/05/22   Vicci Rollo SAUNDERS, PA-C  atorvastatin  (LIPITOR ) 80 MG tablet Take 1 tablet (80 mg total) by mouth daily. 11/17/23   Court Dorn PARAS, MD  ezetimibe  (ZETIA ) 10 MG tablet Take 1 tablet (10 mg total) by mouth daily. 04/21/23 06/23/24  Court Dorn PARAS, MD  fluticasone  (FLONASE ) 50 MCG/ACT nasal spray Place 2 sprays into both nostrils as needed. 11/12/23 11/11/24  [provider]  furosemide (LASIX) 20 MG tablet Take 20 mg by mouth as needed. 10/08/22   [provider]  levothyroxine  (SYNTHROID ) 125  MCG tablet Take 1 tablet by mouth daily. 06/22/22   [provider]  metoprolol  tartrate (LOPRESSOR ) 25 MG tablet Take 1/2 (one-half) tablet by mouth twice daily 05/10/24   Court Dorn PARAS, MD  nicotine  (NICODERM CQ  - DOSED IN MG/24 HOURS) 21 mg/24hr patch Place 1 patch (21 mg total) onto the skin daily. 03/26/22   Goodrich, Callie E, PA-C  nitroGLYCERIN  (NITROSTAT ) 0.4 MG SL tablet Place 1 tablet (0.4 mg total) under the tongue every 5 (five) minutes as needed for chest pain. 11/17/23   Court Dorn PARAS, MD  omeprazole (PRILOSEC) 20 MG capsule Take 20 mg by mouth daily. 12/06/21   [provider]  ticagrelor  (BRILINTA ) 60 MG TABS tablet Take 1 tablet by mouth twice daily 05/11/24   Court Dorn PARAS, MD  VASCEPA  1 g capsule Take 2 capsules (2 g total) by mouth 2 (two) times daily. 05/09/24   Court Dorn PARAS, MD    Allergies: Adhesive [tape] and Latex    Review of Systems  Respiratory:  Positive for shortness of breath.   All other systems reviewed and are negative.   Updated Vital Signs BP 125/76   Pulse 89   Temp 98.2 F (36.8 C) (Oral)   Resp (!) 21   Ht 5' 9 (1.753 m)   Wt 86.2 kg   SpO2 91%   BMI 28.06 kg/m   Physical Exam Vitals and nursing note  reviewed.  Constitutional:      General: He is not in acute distress.    Appearance: Normal appearance. He is not ill-appearing.  HENT:     Head: Normocephalic and atraumatic.     Nose: Nose normal.     Mouth/Throat:     Mouth: Mucous membranes are moist.  Eyes:     Extraocular Movements: Extraocular movements intact.     Conjunctiva/sclera: Conjunctivae normal.     Pupils: Pupils are equal, round, and reactive to light.  Cardiovascular:     Rate and Rhythm: Normal rate and regular rhythm.     Pulses: Normal pulses.     Heart sounds: No murmur heard.    No gallop.  Pulmonary:     Effort: Pulmonary effort is normal. Tachypnea present.     Breath sounds: Wheezing present. No decreased breath sounds, rhonchi  or rales.  Chest:     Chest wall: No tenderness.  Abdominal:     General: Abdomen is flat. Bowel sounds are normal.     Palpations: Abdomen is soft.     Tenderness: There is no abdominal tenderness. There is no guarding.  Musculoskeletal:        General: Normal range of motion.     Cervical back: Normal range of motion and neck supple.     Right lower leg: No edema.     Left lower leg: No edema.  Skin:    General: Skin is warm and dry.     Findings: No ecchymosis or rash.  Neurological:     General: No focal deficit present.     Mental Status: He is alert and oriented to person, place, and time. Mental status is at baseline.  Psychiatric:        Mood and Affect: Mood normal.        Behavior: Behavior normal.        Thought Content: Thought content normal.        Judgment: Judgment normal.     (all labs ordered are listed, but only abnormal results are displayed) Labs Reviewed  RESP PANEL BY RT-PCR (RSV, FLU A&B, COVID)  [id number]  BASIC METABOLIC PANEL WITH GFR  CBC  HEPATIC FUNCTION PANEL  TROPONIN I (HIGH SENSITIVITY)    EKG: None  Radiology: No results found.   Procedures   Medications Ordered in the ED  ipratropium-albuterol  (DUONEB) 0.5-2.5 (3) MG/3ML nebulizer solution 3 mL (has no administration in time range)  dexamethasone  (DECADRON ) injection 8 mg (has no administration in time range)                                    Medical Decision Making Amount and/or Complexity of Data Reviewed Labs: ordered. Radiology: ordered.  Risk Prescription drug management. Decision regarding hospitalization.   This patient presents to the ED for concern of cough, congestion, shortness of breath this involves an extensive number of treatment options, and is a complaint that carries with it a high risk of complications and morbidity.  The differential diagnosis includes COPD, asthma, CHF, acute viral syndrome, pneumonia, pulmonary embolus, ACS   Co morbidities that  complicate the patient evaluation  Previous smoker   Additional history obtained:  Additional history obtained from medical records External records from outside source obtained and reviewed including medical records   Lab Tests:  I Ordered, and personally interpreted labs.  The pertinent results include: No leukocytosis, no anemia, mild  hypokalemia, normal kidney function and liver function, negative troponin, negative viral swab   Imaging Studies ordered:  I ordered imaging studies including chest x-ray I independently visualized and interpreted imaging which showed vascular congestion I agree with the radiologist interpretation   Cardiac Monitoring: / EKG:  The patient was maintained on a cardiac monitor.  I personally viewed and interpreted the cardiac monitored which showed an underlying rhythm of: Normal sinus rhythm, no ST/T wave changes, no ischemic changes, no STEMI   Consultations Obtained:  I requested consultation with the hospitalist,  and discussed lab and imaging findings as well as pertinent plan - they recommend: Admission   Problem List / ED Course / Critical interventions / Medication management  Patient is doing well at this time and has remained stable.  He is currently on 2 L nasal cannula.  He was hypoxic on his presentation at approximately 88% on room air.  He is a previous smoker but denies any known history of COPD or asthma.  Workup thus far has been unremarkable.  Chest x-ray demonstrates no acute consolidation.  He had negative viral swab.  Will plan for admission given his Opyd respiratory failure.  Have discussed patient case with Dr. Evonnie with the hospital service who has excepted for admission.  Low suspicion viral ideology such as ACS or pulmonary embolus. I ordered medication including DuoNeb, potassium, Decadron  for wheezing and hypoxia Reevaluation of the patient after these medicines showed that the patient improved I have reviewed the  patients home medicines and have made adjustments as needed   Social Determinants of Health:  None   Test / Admission - Considered:  Admission     Final diagnoses:  None    ED Discharge Orders     None          Daralene Lonni JONETTA DEVONNA 07/03/24 1502    Dean Clarity, MD 07/03/24 1507

## 2024-07-03 NOTE — Discharge Instructions (Signed)
 Go directly to the emergency department for further evaluation given your very low oxygen  levels.

## 2024-07-03 NOTE — ED Notes (Signed)
PA at bedside during triage.

## 2024-07-03 NOTE — ED Triage Notes (Signed)
 Pt states is oxygen  is dropping and he is congested. Ra at 88 % RA .

## 2024-07-03 NOTE — H&P (Signed)
 History and Physical    Patient: Bruce Mcgee FMW:[id number] DOB: [birth year] DOA: 07/03/2024 DOS: the patient was seen and examined on 07/03/2024 PCP: Debrah Josette ORN., PA-C  Patient coming from: Home  Chief Complaint:  Chief Complaint  Patient presents with   Shortness of Breath   HPI: Bruce Mcgee is a 68 year old male with a history of coronary artery disease status post CABG 02/2022, hyperlipidemia, GERD, hypothyroidism, and right upper lung mass presenting with at least 2-day history of shortness of breath, fatigue, myalgias, and worsening nonproductive cough.  The patient denies any fevers or chills, headache, neck pain, chest pain, hemoptysis, nausea, vomiting, diarrhea.  He has had some abdominal discomfort from the coughing.  He denies any hematochezia, melena, dysuria, hematuria. He continues to smoke about 2 to 3 cigarettes/day.  At his heaviest, the patient was smoking 3 packs/day since the age of 8. He went to urgent care on the morning of 07/03/2024.  He was noted to be hypoxic with oxygen  saturation 85% on room air.  He was placed on 2 L and sent to the emergency department for further evaluation and treatment.  Notably, the patient states that his wife was tested COVID-positive at urgent care on the day of admission. In the ED, the patient was afebrile hemodynamically stable with oxygen  saturation 92% on 2 L.  WBC 10.2, hemoglobin 14.0, platelets 302.  Sodium 140, potassium 3.4, bicarbonate 27, serum creatinine 0.85.  Chest x-ray showed mild diffuse interstitial markings.  The patient was given bronchodilators and dexamethasone  in the ED.  He was admitted for further evaluation and treatment of his respiratory failure.   Review of Systems: As mentioned in the history of present illness. All other systems reviewed and are negative. Past Medical History:  Diagnosis Date   Acute ST elevation myocardial infarction (STEMI) of inferior wall (HCC) 03/02/2022   CAD (coronary  artery disease)    Headache    History of kidney stones    Hyperlipidemia    Hypothyroidism    Tobacco abuse    Past Surgical History:  Procedure Laterality Date   APPENDECTOMY     CORONARY/GRAFT ACUTE MI REVASCULARIZATION N/A 03/03/2022   Procedure: Coronary/Graft Acute MI Revascularization;  Surgeon: Court Dorn PARAS, MD;  Location: [facility];  Service: Cardiovascular;  Laterality: N/A;   INCISIONAL HERNIA REPAIR N/A 03/18/2018   Procedure: INCISIONAL HERNIA REPAIR,  RECTRARECTAL TRANSVERSUS ABDOMINAL RELEASE;  Surgeon: Kinsinger, Herlene Righter, MD;  Location: [facility];  Service: General;  Laterality: N/A;   INSERTION OF MESH N/A 03/18/2018   Procedure: INSERTION OF MESH;  Surgeon: Stevie Herlene Righter, MD;  Location: [facility];  Service: General;  Laterality: N/A;   LEFT HEART CATH AND CORONARY ANGIOGRAPHY N/A 03/03/2022   Procedure: LEFT HEART CATH AND CORONARY ANGIOGRAPHY;  Surgeon: Court Dorn PARAS, MD;  Location: [facility];  Service: Cardiovascular;  Laterality: N/A;   LIPOMA EXCISION     on left shoulder   Social History:  reports that he has quit smoking. His smoking use included cigarettes. He has never used smokeless tobacco. He reports that he does not drink alcohol and does not use drugs.  Allergies  Allergen Reactions   Adhesive [Tape] Other (See Comments)    Pulls off skin    Latex Other (See Comments)    Blisters    No family history on file.  Prior to Admission medications   Medication Sig Start Date End Date Taking? Authorizing Provider  acetaminophen  (TYLENOL ) 500  MG tablet Take 1,000 mg by mouth every 6 (six) hours as needed for mild pain.    [provider]  albuterol  (PROVENTIL  HFA;VENTOLIN  HFA) 108 (90 Base) MCG/ACT inhaler Inhale 2 puffs into the lungs every 4 (four) hours as needed for wheezing or shortness of breath.  02/18/18   [provider]  aspirin  81 MG chewable tablet Chew 1 tablet (81 mg total) by mouth daily. 03/05/22    Vicci Rollo SAUNDERS, PA-C  atorvastatin  (LIPITOR ) 80 MG tablet Take 1 tablet (80 mg total) by mouth daily. 11/17/23   Court Dorn PARAS, MD  ezetimibe  (ZETIA ) 10 MG tablet Take 1 tablet (10 mg total) by mouth daily. 04/21/23 06/23/24  Court Dorn PARAS, MD  fluticasone  (FLONASE ) 50 MCG/ACT nasal spray Place 2 sprays into both nostrils as needed. 11/12/23 11/11/24  [provider]  furosemide (LASIX) 20 MG tablet Take 20 mg by mouth as needed. 10/08/22   [provider]  levothyroxine  (SYNTHROID ) 125 MCG tablet Take 1 tablet by mouth daily. 06/22/22   [provider]  metoprolol  tartrate (LOPRESSOR ) 25 MG tablet Take 1/2 (one-half) tablet by mouth twice daily 05/10/24   Court Dorn PARAS, MD  nicotine  (NICODERM CQ  - DOSED IN MG/24 HOURS) 21 mg/24hr patch Place 1 patch (21 mg total) onto the skin daily. 03/26/22   Goodrich, Callie E, PA-C  nitroGLYCERIN  (NITROSTAT ) 0.4 MG SL tablet Place 1 tablet (0.4 mg total) under the tongue every 5 (five) minutes as needed for chest pain. 11/17/23   Court Dorn PARAS, MD  omeprazole (PRILOSEC) 20 MG capsule Take 20 mg by mouth daily. 12/06/21   [provider]  ticagrelor  (BRILINTA ) 60 MG TABS tablet Take 1 tablet by mouth twice daily 05/11/24   Court Dorn PARAS, MD  VASCEPA  1 g capsule Take 2 capsules (2 g total) by mouth 2 (two) times daily. 05/09/24   Court Dorn PARAS, MD    Physical Exam: Vitals:   07/03/24 1238 07/03/24 1243  BP: 125/76   Pulse: 80 89  Resp:  (!) 21  Temp:  98.2 F (36.8 C)  TempSrc:  Oral  SpO2: 91% 91%  Weight: 86.2 kg   Height: 5' 9 (1.753 m)    GENERAL:  A&O x 3, NAD, well developed, cooperative, follows commands HEENT: [place]/AT, No thrush, No icterus, No oral ulcers Neck:  No neck mass, No meningismus, soft, supple CV: RRR, no S3, no S4, no rub, no JVD Lungs: Diminished breath sounds.  Bibasilar rales.  Bilateral expiratory wheeze. 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 Thickening of the adjacent right upper lobe airways with areas of interlobular and intralobular septal thickening. More peripherally there are patchy groundglass opacities.  - Check viral respiratory panel -start empiric ceftriaxone zenovia - PCT  COPD exacerbation - Start Brovana  - Start Pulmicort  - Continue IV Solu-Medrol  - Continue DuoNebs  Right perihilar mass - Patient has EBUS scheduled for 07/07/2024  Coronary disease - No chest pain  presently - Continue aspirin  - Holding Brilinta  in preparation for the patient's EBUS  Mixed hyperlipidemia - Continue statin and Zetia   Hypothyroidism - Continue Synthroid   Hypokalemia - Replete - Check magnesium    Advance Care Planning: FULL  Consults: none  Family Communication: none  Severity of Illness: The appropriate patient status for this patient is INPATIENT. Inpatient status is judged to be reasonable and necessary in order to provide the required intensity of service to ensure the patient's safety. The patient's presenting symptoms, physical exam findings, and initial radiographic and laboratory data in the context of their chronic comorbidities is felt to place them at high risk for further clinical deterioration. Furthermore, it is not anticipated that the patient will be medically stable for discharge from the hospital within 2 midnights of admission.   * I certify that at the point of admission it is  my clinical judgment that the patient will require inpatient hospital care spanning beyond 2 midnights from the point of admission due to high intensity of service, high risk for further deterioration and high frequency of surveillance required.*  Author: Alm Schneider, MD 07/03/2024 3:02 PM  For on call review [email or web address].

## 2024-07-04 DIAGNOSIS — Z720 Tobacco use: Secondary | ICD-10-CM | POA: Diagnosis not present

## 2024-07-04 DIAGNOSIS — J9601 Acute respiratory failure with hypoxia: Secondary | ICD-10-CM | POA: Diagnosis not present

## 2024-07-04 DIAGNOSIS — J441 Chronic obstructive pulmonary disease with (acute) exacerbation: Secondary | ICD-10-CM | POA: Diagnosis not present

## 2024-07-04 DIAGNOSIS — E782 Mixed hyperlipidemia: Secondary | ICD-10-CM | POA: Diagnosis not present

## 2024-07-04 LAB — BLOOD GAS, VENOUS
Acid-Base Excess: 3.4 mmol/L — ABNORMAL HIGH (ref 0.0–2.0)
Bicarbonate: 28.5 mmol/L — ABNORMAL HIGH (ref 20.0–28.0)
Drawn by: 8748
O2 Saturation: 91.8 %
Patient temperature: 37.2
pCO2, Ven: 44 mmHg (ref 44–60)
pH, Ven: 7.42 (ref 7.25–7.43)
pO2, Ven: 66 mmHg — ABNORMAL HIGH (ref 32–45)

## 2024-07-04 LAB — BASIC METABOLIC PANEL WITH GFR
Anion gap: 8 (ref 5–15)
BUN: 19 mg/dL (ref 8–23)
CO2: 26 mmol/L (ref 22–32)
Calcium: 8.3 mg/dL — ABNORMAL LOW (ref 8.9–10.3)
Chloride: 104 mmol/L (ref 98–111)
Creatinine, Ser: 0.81 mg/dL (ref 0.61–1.24)
GFR, Estimated: >60 mL/min (ref >60)
Glucose, Bld: 149 mg/dL — ABNORMAL HIGH (ref 70–99)
Potassium: 3.8 mmol/L (ref 3.5–5.1)
Sodium: 138 mmol/L (ref 135–145)

## 2024-07-04 LAB — CBC
HCT: 41.4 % (ref 39.0–52.0)
Hemoglobin: 13.3 g/dL (ref 13.0–17.0)
MCH: 28.5 pg (ref 26.0–34.0)
MCHC: 32.1 g/dL (ref 30.0–36.0)
MCV: 88.7 fL (ref 80.0–100.0)
Platelets: 310 K/uL (ref 150–400)
RBC: 4.67 MIL/uL (ref 4.22–5.81)
RDW: 14.6 % (ref 11.5–15.5)
WBC: 13.9 K/uL — ABNORMAL HIGH (ref 4.0–10.5)
nRBC: 0 % (ref 0.0–0.2)

## 2024-07-04 LAB — STREP PNEUMONIAE URINARY ANTIGEN: Strep Pneumo Urinary Antigen: NEGATIVE

## 2024-07-04 LAB — MAGNESIUM: Magnesium: 2.1 mg/dL (ref 1.7–2.4)

## 2024-07-04 MED ORDER — IPRATROPIUM-ALBUTEROL 0.5-2.5 (3) MG/3ML IN SOLN
3.0000 mL | Freq: Three times a day (TID) | RESPIRATORY_TRACT | Status: AC
  Administered 2024-07-04 (×3): 3 mL via RESPIRATORY_TRACT
  Filled 2024-07-04 (×3): qty 3

## 2024-07-04 MED ORDER — TICAGRELOR 60 MG PO TABS
60.0000 mg | ORAL_TABLET | Freq: Two times a day (BID) | ORAL | Status: DC
  Administered 2024-07-05 (×2): 60 mg via ORAL
  Filled 2024-07-04 (×4): qty 1

## 2024-07-04 MED ORDER — TICAGRELOR 60 MG PO TABS: 60.0000 mg | ORAL_TABLET | Freq: Two times a day (BID) | ORAL | Status: DC

## 2024-07-04 MED ORDER — REVEFENACIN 175 MCG/3ML IN SOLN
175.0000 ug | Freq: Every day | RESPIRATORY_TRACT | Status: DC
  Administered 2024-07-05: 175 ug via RESPIRATORY_TRACT
  Filled 2024-07-04: qty 3

## 2024-07-04 MED ORDER — POTASSIUM CHLORIDE CRYS ER 20 MEQ PO TBCR
20.0000 meq | EXTENDED_RELEASE_TABLET | Freq: Once | ORAL | Status: AC
  Administered 2024-07-04: 20 meq via ORAL
  Filled 2024-07-04: qty 1

## 2024-07-04 MED ORDER — ALBUTEROL SULFATE (2.5 MG/3ML) 0.083% IN NEBU
2.5000 mg | INHALATION_SOLUTION | Freq: Three times a day (TID) | RESPIRATORY_TRACT | Status: DC
  Administered 2024-07-05: 2.5 mg via RESPIRATORY_TRACT
  Filled 2024-07-04: qty 3

## 2024-07-04 MED ORDER — HYDROCODONE BIT-HOMATROP MBR 5-1.5 MG/5ML PO SOLN
5.0000 mL | ORAL | Status: DC | PRN
  Administered 2024-07-04 – 2024-07-05 (×4): 5 mL via ORAL
  Filled 2024-07-04 (×4): qty 5

## 2024-07-04 MED ORDER — IPRATROPIUM-ALBUTEROL 0.5-2.5 (3) MG/3ML IN SOLN: 3.0000 mL | Freq: Three times a day (TID) | RESPIRATORY_TRACT | 1 refills | Status: AC

## 2024-07-04 NOTE — Progress Notes (Signed)
 PROGRESS NOTE  Bruce Mcgee FMW:[id number] DOB: 07-Aug-1956 DOA: 07/03/2024 PCP: Debrah Josette ORN., PA-C  Brief History:  68 year old male with a history of coronary artery disease status post CABG 02/2022, hyperlipidemia, GERD, hypothyroidism, and right upper lung mass presenting with at least 2-day history of shortness of breath, fatigue, myalgias, and worsening nonproductive cough.  The patient denies any fevers or chills, headache, neck pain, chest pain, hemoptysis, nausea, vomiting, diarrhea.  He has had some abdominal discomfort from the coughing.  He denies any hematochezia, melena, dysuria, hematuria. He continues to smoke about 2 to 3 cigarettes/day.  At his heaviest, the patient was smoking 3 packs/day since the age of 77. He went to urgent care on the morning of 07/03/2024.  He was noted to be hypoxic with oxygen  saturation 85% on room air.  He was placed on 2 L and sent to the emergency department for further evaluation and treatment.  Notably, the patient states that his wife was tested COVID-positive at urgent care on the day of admission. In the ED, the patient was afebrile hemodynamically stable with oxygen  saturation 92% on 2 L.  WBC 10.2, hemoglobin 14.0, platelets 302.  Sodium 140, potassium 3.4, bicarbonate 27, serum creatinine 0.85.  Chest x-ray showed mild diffuse interstitial markings.  The patient was given bronchodilators and dexamethasone  in the ED.  He was admitted for further evaluation and treatment of his respiratory failure.   Assessment/Plan: Acute respiratory failure with hypoxia -Secondary COPD exacerbation - 07/04/24 VBG 7.42/44/66/28 - Currently stable on 2 L - Wean oxygen  as tolerated - COVID-19 PCR negative - 06/14/24 LDCT--Right right upper lobe perihilar mass measuring 4.5 x 4.0 x 3.4 cm (CC by AP by TV) with invasion into the mediastinum. Thickening of the adjacent right upper lobe airways with areas of interlobular and intralobular septal  thickening. More peripherally there are patchy groundglass opacities.  - Check viral respiratory panel>>POSITIVE Rhinovirus/Enterovirus -start empiric ceftriaxone /azithro - PCT <0.10 - d/c ceftriaxone , continue azithro   COPD exacerbation - Start Brovana  - Start Pulmicort  - Continue IV Solu-Medrol  - Continue DuoNebs - add hycodan - add Yupelri    Right perihilar mass - Patient has EBUS scheduled for 07/07/2024   Coronary disease - No chest pain presently - Continue aspirin  - Holding Brilinta  in preparation for the patient's EBUS   Mixed hyperlipidemia - Continue statin and Zetia    Hypothyroidism - Continue Synthroid    Hypokalemia - Replete - Check magnesium 2.1          Family Communication: no  Family at bedside  Consultants:  none  Code Status:  FULL   DVT Prophylaxis:  [place] Lovenox    Procedures: As Listed in Progress Note Above  Antibiotics: Azithromycin  8/24>> Ceftriaxone  8/24     Subjective: Pt states breathing/sob is at least 25% better.  Denies cp, n/v/d, abd pain f/c  Objective: Vitals:   07/04/24 0753 07/04/24 0756 07/04/24 0759 07/04/24 0853  BP:    120/71  Pulse:    (!) 104  Resp:      Temp:    98.5 F (36.9 C)  TempSrc:    Oral  SpO2: 92% 94% 99% 92%  Weight:      Height:        Intake/Output Summary (Last 24 hours) at 07/04/2024 1039 Last data filed at 07/04/2024 0844 Gross per 24 hour  Intake 447.15 ml  Output --  Net 447.15 ml   Weight change:  Exam:  General:  Pt is alert, follows commands appropriately, not in acute distress HEENT: No icterus, No thrush, No neck mass, [place]/AT Cardiovascular: RRR, S1/S2, no rubs, no gallops Respiratory: diminished BS.  Bibasilar wheeze Abdomen: Soft/+BS, non tender, non distended, no guarding Extremities: No edema, No lymphangitis, No petechiae, No rashes, no synovitis   Data Reviewed: I have personally reviewed following labs and imaging studies Basic Metabolic Panel: Recent Labs   Lab 07/03/24 1256 07/04/24 0510  NA 140 138  K 3.4* 3.8  CL 102 104  CO2 27 26  GLUCOSE 102* 149*  BUN 13 19  CREATININE 0.85 0.81  CALCIUM  8.3* 8.3*  MG  --  2.1   Liver Function Tests: Recent Labs  Lab 07/03/24 1256  AST 22  ALT 24  ALKPHOS 125  BILITOT 0.6  PROT 6.7  ALBUMIN 3.2*   No results for input(s): LIPASE, AMYLASE in the last 168 hours. No results for input(s): AMMONIA in the last 168 hours. Coagulation Profile: No results for input(s): INR, PROTIME in the last 168 hours. CBC: Recent Labs  Lab 07/03/24 1256 07/04/24 0510  WBC 10.2 13.9*  HGB 14.0 13.3  HCT 43.5 41.4  MCV 89.3 88.7  PLT 302 310   Cardiac Enzymes: No results for input(s): CKTOTAL, CKMB, CKMBINDEX, TROPONINI in the last 168 hours. BNP: Invalid input(s): POCBNP CBG: No results for input(s): GLUCAP in the last 168 hours. HbA1C: No results for input(s): HGBA1C in the last 72 hours. Urine analysis:    Component Value Date/Time   COLORURINE YELLOW 03/08/2018 0011   APPEARANCEUR CLEAR 03/08/2018 0011   LABSPEC 1.031 (H) 03/08/2018 0011   PHURINE 5.0 03/08/2018 0011   GLUCOSEU NEGATIVE 03/08/2018 0011   HGBUR NEGATIVE 03/08/2018 0011   BILIRUBINUR NEGATIVE 03/08/2018 0011   KETONESUR NEGATIVE 03/08/2018 0011   PROTEINUR NEGATIVE 03/08/2018 0011   NITRITE NEGATIVE 03/08/2018 0011   LEUKOCYTESUR NEGATIVE 03/08/2018 0011   Sepsis Labs: @LABRCNTIP (procalcitonin:4,lacticidven:4) ) Recent Results (from the past 240 hours)  Resp panel by RT-PCR (RSV, Flu A&B, Covid) Anterior Nasal Swab     Status: None   Collection Time: 07/03/24  1:02 PM   Specimen: Anterior Nasal Swab  Result Value Ref Range Status   SARS Coronavirus 2 by RT PCR NEGATIVE NEGATIVE Final    Comment: (NOTE) SARS-CoV-2 target nucleic acids are NOT DETECTED.  The SARS-CoV-2 RNA is generally detectable in upper respiratory specimens during the acute phase of infection. The  lowest concentration of SARS-CoV-2 viral copies this assay can detect is 138 copies/mL. A negative result does not preclude SARS-Cov-2 infection and should not be used as the sole basis for treatment or other patient management decisions. A negative result may occur with  improper specimen collection/handling, submission of specimen other than nasopharyngeal swab, presence of viral mutation(s) within the areas targeted by this assay, and inadequate number of viral copies(<138 copies/mL). A negative result must be combined with clinical observations, patient history, and epidemiological information. The expected result is Negative.  Fact Sheet for Patients:  BloggerCourse.com  Fact Sheet for Healthcare Providers:  SeriousBroker.it  This test is no t yet approved or cleared by the [place]  FDA and  has been authorized for detection and/or diagnosis of SARS-CoV-2 by FDA under an Emergency Use Authorization (EUA). This EUA will remain  in effect (meaning this test can be used) for the duration of the COVID-19 declaration under Section 564(b)(1) of the Act, 21 U.S.C.section 360bbb-3(b)(1), unless the authorization is terminated  or revoked sooner.  Influenza A by PCR NEGATIVE NEGATIVE Final   Influenza B by PCR NEGATIVE NEGATIVE Final    Comment: (NOTE) The Xpert Xpress SARS-CoV-2/FLU/RSV plus assay is intended as an aid in the diagnosis of influenza from Nasopharyngeal swab specimens and should not be used as a sole basis for treatment. Nasal washings and aspirates are unacceptable for Xpert Xpress SARS-CoV-2/FLU/RSV testing.  Fact Sheet for Patients: BloggerCourse.com  Fact Sheet for Healthcare Providers: SeriousBroker.it  This test is not yet approved or cleared by the [place]  FDA and has been authorized for detection and/or diagnosis of SARS-CoV-2 by FDA under  an Emergency Use Authorization (EUA). This EUA will remain in effect (meaning this test can be used) for the duration of the COVID-19 declaration under Section 564(b)(1) of the Act, 21 U.S.C. section 360bbb-3(b)(1), unless the authorization is terminated or revoked.     Resp Syncytial Virus by PCR NEGATIVE NEGATIVE Final    Comment: (NOTE) Fact Sheet for Patients: BloggerCourse.com  Fact Sheet for Healthcare Providers: SeriousBroker.it  This test is not yet approved or cleared by the [place]  FDA and has been authorized for detection and/or diagnosis of SARS-CoV-2 by FDA under an Emergency Use Authorization (EUA). This EUA will remain in effect (meaning this test can be used) for the duration of the COVID-19 declaration under Section 564(b)(1) of the Act, 21 U.S.C. section 360bbb-3(b)(1), unless the authorization is terminated or revoked.  Performed at [facility], [address], [place], [place] [address]   Respiratory (~20 pathogens) panel by PCR     Status: Abnormal   Collection Time: 07/03/24  4:27 PM   Specimen: Nasopharyngeal Swab; Respiratory  Result Value Ref Range Status   Adenovirus NOT DETECTED NOT DETECTED Final   Coronavirus 229E NOT DETECTED NOT DETECTED Final    Comment: (NOTE) The Coronavirus on the Respiratory Panel, DOES NOT test for the novel  Coronavirus (2019 nCoV)    Coronavirus HKU1 NOT DETECTED NOT DETECTED Final   Coronavirus NL63 NOT DETECTED NOT DETECTED Final   Coronavirus OC43 NOT DETECTED NOT DETECTED Final   Metapneumovirus NOT DETECTED NOT DETECTED Final   Rhinovirus / Enterovirus DETECTED (A) NOT DETECTED Final   Influenza A NOT DETECTED NOT DETECTED Final   Influenza B NOT DETECTED NOT DETECTED Final   Parainfluenza Virus 1 NOT DETECTED NOT DETECTED Final   Parainfluenza Virus 2 NOT DETECTED NOT DETECTED Final   Parainfluenza Virus 3 NOT DETECTED NOT DETECTED Final   Parainfluenza  Virus 4 NOT DETECTED NOT DETECTED Final   Respiratory Syncytial Virus NOT DETECTED NOT DETECTED Final   Bordetella pertussis NOT DETECTED NOT DETECTED Final   Bordetella Parapertussis NOT DETECTED NOT DETECTED Final   Chlamydophila pneumoniae NOT DETECTED NOT DETECTED Final   Mycoplasma pneumoniae NOT DETECTED NOT DETECTED Final    Comment: Performed at [facility], [address], [place], [place] [address]  Culture, blood (Routine X 2) w Reflex to ID Panel     Status: None (Preliminary result)   Collection Time: 07/03/24  5:12 PM   Specimen: BLOOD  Result Value Ref Range Status   Specimen Description BLOOD BLOOD RIGHT HAND  Final   Special Requests   Final    AEROBIC BOTTLE ONLY Blood Culture results may not be optimal due to an inadequate volume of blood received in culture bottles   Culture   Final    NO GROWTH < 24 HOURS Performed at [facility], [address], [place], [place] [address]  Report Status PENDING  Incomplete  Culture, blood (Routine X 2) w Reflex to ID Panel     Status: None (Preliminary result)   Collection Time: 07/03/24  5:17 PM   Specimen: BLOOD  Result Value Ref Range Status   Specimen Description BLOOD BLOOD LEFT ARM  Final   Special Requests   Final    BOTTLES DRAWN AEROBIC AND ANAEROBIC Blood Culture adequate volume   Culture   Final    NO GROWTH < 24 HOURS Performed at [facility], [address] [place], [place] [address]    Report Status PENDING  Incomplete     Scheduled Meds:  arformoterol   15 mcg Nebulization BID   aspirin   81 mg Oral Daily   atorvastatin   80 mg Oral Daily   azithromycin   500 mg Oral Daily   budesonide  (PULMICORT ) nebulizer solution  0.5 mg Nebulization BID   enoxaparin  (LOVENOX ) injection  40 mg Subcutaneous Q24H   ezetimibe   10 mg Oral Daily   guaiFENesin   1,200 mg Oral BID   icosapent  Ethyl  2 g Oral BID   ipratropium-albuterol   3 mL Nebulization TID   levothyroxine   125 mcg Oral Q0600   methylPREDNISolone   (SOLU-MEDROL ) injection  60 mg Intravenous Q12H   metoprolol  tartrate  12.5 mg Oral BID   pantoprazole   40 mg Oral Daily   Continuous Infusions:  cefTRIAXone  (ROCEPHIN )  IV Stopped (07/03/24 1735)    Procedures/Studies: DG Chest 2 View Result Date: 07/03/2024 CLINICAL DATA:  Shortness of breath. EXAM: CHEST - 2 VIEW COMPARISON:  Chest radiograph dated 03/02/2022. FINDINGS: No focal consolidation, pleural effusion or pneumothorax. There is mild diffuse interstitial prominence and nodularity may represent edema. The cardiac silhouette is within limits. No acute osseous pathology. IMPRESSION: Mild diffuse interstitial prominence and nodularity may represent edema. No focal consolidation. Electronically Signed   By: Vanetta Chou M.D.   On: 07/03/2024 13:52    Alm Schneider, DO  [facility]  If 7PM-7AM, please contact night-coverage [email or web address] Password [email or web address] 07/04/2024, 10:39 AM   LOS: 1 day

## 2024-07-04 NOTE — Progress Notes (Signed)
 Afternoon rounds   Subjective: Pt states he took a shower--states he did well, did not get too sob.  Walked hallway, about 50 feet, not too sob--no distress.  No cp  Vitals:   07/04/24 0759 07/04/24 0853 07/04/24 1429 07/04/24 1456  BP:  120/71  124/65  Pulse:  (!) 104  92  Resp:      Temp:  98.5 F (36.9 C)  97.9 F (36.6 C)  TempSrc:  Oral  Oral  SpO2: 99% 92% (!) 83% 91%  Weight:      Height:       CV--RRR Lung--diminished BS.  Bibasilar rales.  Minimal basilar wheeze Abd--soft+BS/NT   Assessment/Plan:  COPD Exacerbation -pt states he called his pulmonologist to update her that he's in hospital -pt states his bronchoscopy and EBUS will be cancelled for 03/07/24 and rescheduled for 4 weeks from now -pt requested his Brillinta be restarted -I will restart Brillinta jeneen Lenis Maralee Higuchi, DO [facility]

## 2024-07-04 NOTE — Discharge Summary (Incomplete)
 Physician Discharge Summary   Patient: Bruce Mcgee MRN: [id number] DOB: Feb 01, 1956  Admit date:     07/03/2024  Discharge date: 07/05/2024  Discharge Physician: Alm Lissie Hinesley   PCP: Debrah Josette ORN., PA-C   Recommendations at discharge:   Please follow up with primary care provider within 1-2 weeks  Please repeat BMP and CBC in one week   Hospital Course: 68 year old male with a history of coronary artery disease status post CABG 02/2022, hyperlipidemia, GERD, hypothyroidism, and right upper lung mass presenting with at least 2-day history of shortness of breath, fatigue, myalgias, and worsening nonproductive cough.  The patient denies any fevers or chills, headache, neck pain, chest pain, hemoptysis, nausea, vomiting, diarrhea.  He has had some abdominal discomfort from the coughing.  He denies any hematochezia, melena, dysuria, hematuria. He continues to smoke about 2 to 3 cigarettes/day.  At his heaviest, the patient was smoking 3 packs/day since the age of 19. He went to urgent care on the morning of 07/03/2024.  He was noted to be hypoxic with oxygen  saturation 85% on room air.  He was placed on 2 L and sent to the emergency department for further evaluation and treatment.  Notably, the patient states that his wife was tested COVID-positive at urgent care on the day of admission. In the ED, the patient was afebrile hemodynamically stable with oxygen  saturation 92% on 2 L.  WBC 10.2, hemoglobin 14.0, platelets 302.  Sodium 140, potassium 3.4, bicarbonate 27, serum creatinine 0.85.  Chest x-ray showed mild diffuse interstitial markings.  The patient was given bronchodilators and dexamethasone  in the ED.  He was admitted for further evaluation and treatment of his respiratory failure.  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 Thickening of the adjacent right upper lobe airways with areas of interlobular and intralobular septal thickening. More peripherally there are patchy groundglass opacities.  - Check viral respiratory panel>>POSITIVE Rhinovirus/Enterovirus -start empiric ceftriaxone /azithro - PCT <0.10 - d/c ceftriaxone , continue azithro - d/c home with azithro x 2 more days - ambulated on RA without desaturation <91%   COPD exacerbation - Started Brovana  - Started Pulmicort  - Continue IV Solu-Medrol >>d/c home with prednisone  taper - Continue DuoNebs - added hycodan - added Yupelri    Right perihilar mass - Patient had EBUS scheduled for 07/07/2024 -pt states he called his pulmonologist to update her that he's in hospital -pt states his bronchoscopy and EBUS will be cancelled for 07/07/24 and rescheduled for 08/04/24 -pt requested his Brillinta be restarted -Brillinta restarted 07/04/24 evening -pt will need to coordinate with pulmonology regarding when to restart holding his Brillinta again   Coronary disease - No chest pain presently - Continue aspirin  - Holding Brilinta  in preparation for the patient's EBUS - restart Brillinta when cleared by pulmonary MD after EBUS   Mixed hyperlipidemia - Continue statin and Zetia    Hypothyroidism - Continue Synthroid    Hypokalemia - Replete - Check magnesium 2.1       Consultants: none Procedures performed: none  Disposition: Home Diet recommendation:  Cardiac diet DISCHARGE MEDICATION: Allergies as of 07/05/2024       Reactions   Adhesive [tape] Other (See Comments)   Pulls off skin    Latex Other (See  Comments)   Blisters        Medication List     TAKE these medications    acetaminophen  500 MG tablet Commonly known as: TYLENOL  Take 1,000 mg by mouth every 6 (six) hours as needed for mild pain.   albuterol   108 (90 Base) MCG/ACT inhaler Commonly known as: VENTOLIN  HFA Inhale 2 puffs into the lungs every 4 (four) hours as needed for wheezing or shortness of breath.   aspirin  81 MG chewable tablet Chew 1 tablet (81 mg total) by mouth daily.   atorvastatin  80 MG tablet Commonly known as: LIPITOR  Take 1 tablet (80 mg total) by mouth daily.   azithromycin  500 MG tablet Commonly known as: ZITHROMAX  Take 1 tablet (500 mg total) by mouth daily. Start taking on: July 06, 2024   DELSYM  PO Take 10 mLs by mouth as needed (cough).   ezetimibe  10 MG tablet Commonly known as: ZETIA  Take 1 tablet (10 mg total) by mouth daily.   fluticasone  50 MCG/ACT nasal spray Commonly known as: FLONASE  Place 2 sprays into both nostrils as needed for allergies or rhinitis.   guaifenesin  100 MG/5ML syrup Commonly known as: ROBITUSSIN Take 200 mg by mouth 3 (three) times daily as needed for cough.   ipratropium-albuterol  0.5-2.5 (3) MG/3ML Soln Commonly known as: DUONEB Take 3 mLs by nebulization 3 (three) times daily.   levothyroxine  125 MCG tablet Commonly known as: SYNTHROID  Take 1 tablet by mouth daily.   metoprolol  tartrate 25 MG tablet Commonly known as: LOPRESSOR  Take 1/2 (one-half) tablet by mouth twice daily What changed: See the new instructions.   nitroGLYCERIN  0.4 MG SL tablet Commonly known as: NITROSTAT  Place 1 tablet (0.4 mg total) under the tongue every 5 (five) minutes as needed for chest pain.   omeprazole 20 MG capsule Commonly known as: PRILOSEC Take 20 mg by mouth daily.   predniSONE  10 MG tablet Commonly known as: DELTASONE  Take 6 tablets (60 mg total) by mouth daily with breakfast. And decrease by one tablet daily Start taking on: July 06, 2024   ticagrelor  60 MG Tabs tablet Commonly known as: BRILINTA  Take 1 tablet (60 mg total) by mouth 2 (two) times daily.   Vascepa  1 g capsule Generic drug: icosapent  Ethyl Take 2 capsules (2 g total) by mouth 2 (two) times  daily.        Discharge Exam: Filed Weights   07/03/24 1238 07/03/24 1621  Weight: 86.2 kg 87.6 kg   HEENT:  [place]/AT, No thrush, no icterus CV:  RRR, no rub, no S3, no S4 Lung:  diminished BS.  Bibasilar rales.  No wheeze Abd:  soft/+BS, NT Ext:  No edema, no lymphangitis, no synovitis, no rash   Condition at discharge: stable  The results of significant diagnostics from this hospitalization (including imaging, microbiology, ancillary and laboratory) are listed below for reference.   Imaging Studies: DG Chest 2 View Result Date: 07/03/2024 CLINICAL DATA:  Shortness of breath. EXAM: CHEST - 2 VIEW COMPARISON:  Chest radiograph dated 03/02/2022. FINDINGS: No focal consolidation, pleural effusion or pneumothorax. There is mild diffuse interstitial prominence and nodularity may represent edema. The cardiac silhouette is within limits. No acute osseous pathology. IMPRESSION: Mild diffuse interstitial prominence and nodularity may represent edema. No focal consolidation. Electronically Signed   By: Vanetta Chou M.D.   On: 07/03/2024 13:52    Microbiology: Results for orders placed or performed during the hospital encounter of 07/03/24  Resp panel by RT-PCR (RSV, Flu A&B, Covid) Anterior  Nasal Swab     Status: None   Collection Time: 07/03/24  1:02 PM   Specimen: Anterior Nasal Swab  Result Value Ref Range Status   SARS Coronavirus 2 by RT PCR NEGATIVE NEGATIVE Final    Comment: (NOTE) SARS-CoV-2 target nucleic acids are NOT DETECTED.  The SARS-CoV-2 RNA is generally detectable in upper respiratory specimens during the acute phase of infection. The lowest concentration of SARS-CoV-2 viral copies this assay can detect is 138 copies/mL. A negative result does not preclude SARS-Cov-2 infection and should not be used as the sole basis for treatment or other patient management decisions. A negative result may occur with  improper specimen collection/handling, submission of specimen  other than nasopharyngeal swab, presence of viral mutation(s) within the areas targeted by this assay, and inadequate number of viral copies(<138 copies/mL). A negative result must be combined with clinical observations, patient history, and epidemiological information. The expected result is Negative.  Fact Sheet for Patients:  BloggerCourse.com  Fact Sheet for Healthcare Providers:  SeriousBroker.it  This test is no t yet approved or cleared by the [place]  FDA and  has been authorized for detection and/or diagnosis of SARS-CoV-2 by FDA under an Emergency Use Authorization (EUA). This EUA will remain  in effect (meaning this test can be used) for the duration of the COVID-19 declaration under Section 564(b)(1) of the Act, 21 U.S.C.section 360bbb-3(b)(1), unless the authorization is terminated  or revoked sooner.       Influenza A by PCR NEGATIVE NEGATIVE Final   Influenza B by PCR NEGATIVE NEGATIVE Final    Comment: (NOTE) The Xpert Xpress SARS-CoV-2/FLU/RSV plus assay is intended as an aid in the diagnosis of influenza from Nasopharyngeal swab specimens and should not be used as a sole basis for treatment. Nasal washings and aspirates are unacceptable for Xpert Xpress SARS-CoV-2/FLU/RSV testing.  Fact Sheet for Patients: BloggerCourse.com  Fact Sheet for Healthcare Providers: SeriousBroker.it  This test is not yet approved or cleared by the [place]  FDA and has been authorized for detection and/or diagnosis of SARS-CoV-2 by FDA under an Emergency Use Authorization (EUA). This EUA will remain in effect (meaning this test can be used) for the duration of the COVID-19 declaration under Section 564(b)(1) of the Act, 21 U.S.C. section 360bbb-3(b)(1), unless the authorization is terminated or revoked.     Resp Syncytial Virus by PCR NEGATIVE NEGATIVE Final     Comment: (NOTE) Fact Sheet for Patients: BloggerCourse.com  Fact Sheet for Healthcare Providers: SeriousBroker.it  This test is not yet approved or cleared by the [place]  FDA and has been authorized for detection and/or diagnosis of SARS-CoV-2 by FDA under an Emergency Use Authorization (EUA). This EUA will remain in effect (meaning this test can be used) for the duration of the COVID-19 declaration under Section 564(b)(1) of the Act, 21 U.S.C. section 360bbb-3(b)(1), unless the authorization is terminated or revoked.  Performed at [facility], [address], [place], [place] [address]   Respiratory (~20 pathogens) panel by PCR     Status: Abnormal   Collection Time: 07/03/24  4:27 PM   Specimen: Nasopharyngeal Swab; Respiratory  Result Value Ref Range Status   Adenovirus NOT DETECTED NOT DETECTED Final   Coronavirus 229E NOT DETECTED NOT DETECTED Final    Comment: (NOTE) The Coronavirus on the Respiratory Panel, DOES NOT test for the novel  Coronavirus (2019 nCoV)    Coronavirus HKU1 NOT DETECTED NOT DETECTED Final   Coronavirus NL63 NOT DETECTED NOT DETECTED Final  Coronavirus OC43 NOT DETECTED NOT DETECTED Final   Metapneumovirus NOT DETECTED NOT DETECTED Final   Rhinovirus / Enterovirus DETECTED (A) NOT DETECTED Final   Influenza A NOT DETECTED NOT DETECTED Final   Influenza B NOT DETECTED NOT DETECTED Final   Parainfluenza Virus 1 NOT DETECTED NOT DETECTED Final   Parainfluenza Virus 2 NOT DETECTED NOT DETECTED Final   Parainfluenza Virus 3 NOT DETECTED NOT DETECTED Final   Parainfluenza Virus 4 NOT DETECTED NOT DETECTED Final   Respiratory Syncytial Virus NOT DETECTED NOT DETECTED Final   Bordetella pertussis NOT DETECTED NOT DETECTED Final   Bordetella Parapertussis NOT DETECTED NOT DETECTED Final   Chlamydophila pneumoniae NOT DETECTED NOT DETECTED Final   Mycoplasma pneumoniae NOT DETECTED NOT DETECTED Final     Comment: Performed at [facility], [address] [place], [place] [address]  Culture, blood (Routine X 2) w Reflex to ID Panel     Status: None (Preliminary result)   Collection Time: 07/03/24  5:12 PM   Specimen: BLOOD  Result Value Ref Range Status   Specimen Description BLOOD BLOOD RIGHT HAND  Final   Special Requests   Final    AEROBIC BOTTLE ONLY Blood Culture results may not be optimal due to an inadequate volume of blood received in culture bottles   Culture   Final    NO GROWTH < 24 HOURS Performed at [facility], [address], [place], [place] [address]    Report Status PENDING  Incomplete  Culture, blood (Routine X 2) w Reflex to ID Panel     Status: None (Preliminary result)   Collection Time: 07/03/24  5:17 PM   Specimen: BLOOD  Result Value Ref Range Status   Specimen Description BLOOD BLOOD LEFT ARM  Final   Special Requests   Final    BOTTLES DRAWN AEROBIC AND ANAEROBIC Blood Culture adequate volume   Culture   Final    NO GROWTH < 24 HOURS Performed at [facility], [address], [place], [place] [address]    Report Status PENDING  Incomplete    Labs: CBC: Recent Labs  Lab 07/03/24 1256 07/04/24 0510  WBC 10.2 13.9*  HGB 14.0 13.3  HCT 43.5 41.4  MCV 89.3 88.7  PLT 302 310   Basic Metabolic Panel: Recent Labs  Lab 07/03/24 1256 07/04/24 0510 07/05/24 0426  NA 140 138 138  K 3.4* 3.8 4.5  CL 102 104 102  CO2 27 26 27   GLUCOSE 102* 149* 160*  BUN 13 19 24*  CREATININE 0.85 0.81 1.00  CALCIUM  8.3* 8.3* 8.4*  MG  --  2.1 2.2   Liver Function Tests: Recent Labs  Lab 07/03/24 1256  AST 22  ALT 24  ALKPHOS 125  BILITOT 0.6  PROT 6.7  ALBUMIN 3.2*   CBG: No results for input(s): GLUCAP in the last 168 hours.  Discharge time spent: greater than 30 minutes.  Signed: Alm Schneider, MD [facility] 07/05/2024

## 2024-07-04 NOTE — TOC CM/SW Note (Signed)
 Transition of Care [facility]) - Inpatient Brief Assessment   Patient Details  Name: BILAAL LEIB MRN: [id number] Date of Birth: 1956/06/01  Transition of [facility] CM/SW Contact:    Lucie Lunger, LCSWA Phone Number: 07/04/2024, 10:09 AM   Clinical Narrative: Transition of Care Department [facility]) has reviewed patient and no TOC needs have been identified at this time. We will continue to monitor patient advancement through interdiciplinary progression rounds. If new patient transition needs arise, please place a TOC consult.  Transition of Care Asessment: Insurance and Status: Insurance coverage has been reviewed Patient has primary care physician: Yes Home environment has been reviewed: From home Prior level of function:: Independent Prior/Current Home Services: No current home services Social Drivers of Health Review: SDOH reviewed no interventions necessary Readmission risk has been reviewed: Yes Transition of care needs: no transition of care needs at this time

## 2024-07-04 NOTE — Progress Notes (Signed)
 Mobility Specialist Progress Note:    07/04/24 0930  Mobility  Activity Ambulated with assistance  Level of Assistance Modified independent, requires aide device or extra time  Assistive Device None  Distance Ambulated (ft) 140 ft  Range of Motion/Exercises Active;All extremities  Activity Response Tolerated well  Mobility Referral Yes  Mobility visit 1 Mobility  Mobility Specialist Start Time (ACUTE ONLY) 0930  Mobility Specialist Stop Time (ACUTE ONLY) 0950  Mobility Specialist Time Calculation (min) (ACUTE ONLY) 20 min   Pt received in bed, agreeable to mobility. ModI to stand and ambulate with no AD. Tolerated well, SpO2 92% on 2L at rest. SpO2 88-94% on 2L during ambulation. Left pt sitting EOB, all needs met.   Karalynn Cottone Mobility Specialist Please contact via Special educational needs teacher or  Rehab office at [phone]

## 2024-07-05 DIAGNOSIS — Z720 Tobacco use: Secondary | ICD-10-CM | POA: Diagnosis not present

## 2024-07-05 DIAGNOSIS — J9601 Acute respiratory failure with hypoxia: Secondary | ICD-10-CM | POA: Diagnosis not present

## 2024-07-05 DIAGNOSIS — J441 Chronic obstructive pulmonary disease with (acute) exacerbation: Secondary | ICD-10-CM | POA: Diagnosis not present

## 2024-07-05 LAB — BASIC METABOLIC PANEL WITH GFR
Anion gap: 9 (ref 5–15)
BUN: 24 mg/dL — ABNORMAL HIGH (ref 8–23)
CO2: 27 mmol/L (ref 22–32)
Calcium: 8.4 mg/dL — ABNORMAL LOW (ref 8.9–10.3)
Chloride: 102 mmol/L (ref 98–111)
Creatinine, Ser: 1 mg/dL (ref 0.61–1.24)
GFR, Estimated: >60 mL/min (ref >60)
Glucose, Bld: 160 mg/dL — ABNORMAL HIGH (ref 70–99)
Potassium: 4.5 mmol/L (ref 3.5–5.1)
Sodium: 138 mmol/L (ref 135–145)

## 2024-07-05 LAB — MAGNESIUM: Magnesium: 2.2 mg/dL (ref 1.7–2.4)

## 2024-07-05 MED ORDER — PREDNISONE 10 MG PO TABS: 60.0000 mg | ORAL_TABLET | Freq: Every day | ORAL | 0 refills | Status: AC

## 2024-07-05 MED ORDER — AZITHROMYCIN 500 MG PO TABS: 500.0000 mg | ORAL_TABLET | Freq: Every day | ORAL | 0 refills | Status: AC

## 2024-07-05 MED ORDER — PREDNISONE 20 MG PO TABS: 60.0000 mg | ORAL_TABLET | Freq: Every day | ORAL | Status: DC

## 2024-07-06 LAB — LEGIONELLA PNEUMOPHILA SEROGP 1 UR AG: L. pneumophila Serogp 1 Ur Ag: NEGATIVE

## 2024-07-08 LAB — CULTURE, BLOOD (ROUTINE X 2)
Culture: NO GROWTH
Culture: NO GROWTH
Special Requests: ADEQUATE

## 2024-07-17 ENCOUNTER — Other Ambulatory Visit: Payer: Self-pay | Admitting: Cardiovascular Disease

## 2024-08-01 ENCOUNTER — Other Ambulatory Visit: Payer: Self-pay | Admitting: Cardiovascular Disease

## 2024-10-12 ENCOUNTER — Telehealth: Payer: Self-pay | Admitting: Cardiovascular Disease

## 2024-10-12 NOTE — Telephone Encounter (Signed)
 Pt c/o medication issue:  1. Name of Medication:   ticagrelor  (BRILINTA ) 60 MG TABS tablet    2. How are you currently taking this medication (dosage and times per day)?  Take 1 tablet (60 mg total) by mouth 2 (two) times daily.      3. Are you having a reaction (difficulty breathing--STAT)? No  4. What is your medication issue? Pt is requesting a callback regarding him needing a generic brand of this medication due to insurance not covering it. Please advise

## 2024-10-12 NOTE — Telephone Encounter (Signed)
 Called and unable to reach pt. Left message to call office

## 2024-10-12 NOTE — Telephone Encounter (Signed)
 Called and spoke to patient and patient request generic for Brilinta .Per patient brand is higher and would like generic. Made pt aware that this was forward to pharmacy for advice and recommendations

## 2024-10-12 NOTE — Telephone Encounter (Signed)
 Pt returning call. Please advise.

## 2024-10-17 NOTE — Telephone Encounter (Signed)
 Called and made pt aware there is a generic to Brilinta .Per pt and clinical research associate after checking medication with patient. Patient verbalized he did pickup generic from his pharmacy. All questions were answered and pt verbalized an understanding.

## 2024-10-18 NOTE — Progress Notes (Signed)
 Hematology/Oncology Follow-up Visit  Patient Name:  Bruce Mcgee Date of Birth:  [birth year] Date of Encounter:  10/18/2024  Referring Provider:  No ref. provider found,   PCP:  Kristen Diane Kaplan, PA-C  Diagnoses: 1.  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  Extensive stage based on clinical metastatic disease to brain, MRI 08/16/2024.  2.  Tobacco abuse, ongoing.  3.  Coronary artery disease with history of myocardial infarction, April 2023, status post coronary artery stent.  4.  Hypothyroidism.  Treatment: 1.  Palliative whole brain radiotherapy, 3000 cGy, completed 09/07/2024.  2.  Carboplatin AUC = 5 IV day 1 plus Etoposide 100 mg/m IV day 1-3, cycle length 21 days.  Cycle 1 day 1 equals 10/10/2024.    Hematologic/Oncologic History: Bruce Mcgee is a 68 y.o. who is seen in consultation at the request of Boby Bruckner Da* for an evaluation of small cell lung cancer, right lung, upper lobe, original date of consultation 08/15/2024.  Bruce Mcgee has a past medical history significant for CAD, status post myocardial infarction in April 2023, status post coronary artery stent, appendectomy, abdominal hernia repair with mesh, hypothyroidism and tobacco abuse.  He started smoking at the age of 74 and at his heaviest, smoked up to 3 packs a day.  He was in to see his primary care provider, Josette Aho, PA-C, on 05/17/2024 for complete physical examination.  At that visit, Bruce Mcgee states that Ms. Aho recommended low-dose CT lung cancer screening.  He had never had CT lung cancer screening.  Last available imaging in our record was in May 2024, 2 view chest x-ray, which was unremarkable.  Low-dose CT lung cancer screening on 06/13/2024 revealed a right upper lobe perihilar mass measuring 4.5 x 4.0 x 3.4 cm with invasion into the mediastinum.  7 mm solid  nodule was noted in the superior segment of the right lower lobe.  Multilevel mediastinal and right hilar lymphadenopathy was identified.  PET/CT and tissue sampling was recommended.  He was referred to pulmonary medicine and saw Dr. Karena on 06/21/2024.  PET CT scan on 06/28/2024 demonstrated FDG avid lymph node in right level 4/supraclavicular region measuring 1 x 1.4 cm with max SUV of 9.5.  A right perihilar mass measuring 4.5 x 4.3 cm with max SUV 22 was noted.  Right subpleural nodule measuring 1.3 x 1.5 cm with max SUV 3.1 was noted.  Left upper lobe nodule measuring 7 mm with SUV max of 3 was noted.  There was a new right sided small pleural effusion with mild FDG uptake max SUV 2.4.  Multiple hypermetabolic mediastinal lymph nodes in the right upper paratracheal, right lower paratracheal, anterior mediastinum, right perihilar and right subcarinal and left subcarinal nodal stations were noted consistent with metastatic disease.  He underwent ultrasound-guided core needle biopsy of the right supraclavicular lymph node.  This revealed metastatic small cell carcinoma.  Interim Note:  Bruce Mcgee returns today for follow up of extensive stage small cell lung cancer.  This is a scheduled follow-up visit.  He is currently day 9 of cycle 1 carboplatin, etoposide and atezolizumab.  Overall he feels like he did well with chemotherapy.  Starting about 2 days ago he had some swelling to his right neck and right upper extremity developed.  Yesterday the pain in his arm was pretty significant but today it is better.  He denies any chest  pain or palpitations.  No cough or shortness of breath.  No headaches or changes in vision.  Overall, he tells me today that he is pleased with how well he has tolerated chemotherapy thus far.  ECOG performance status 1.  Review of Systems: All other systems are negative.  Medications:  Current Medications[1]  Past medical history, allergies, current medications,  social history and family history were reviewed and updated when appropriate.  Objective:  BP 114/65   Pulse 78   Temp 98.3 F (36.8 C) (Oral)   Resp 12   Wt 81 kg (178 lb 8 oz)   SpO2 98%   BMI 26.36 kg/m  General: Well-spoken, nontoxic-appearing Caucasian male, no acute distress.  His wife is with him today. HEENT: Face symmetric.  No scleral icterus.  Neck supple.  Swelling noted to the right neck with prominent external jugular vein.  No palpable cord.  Slight tenderness to palpation.  No overlying erythema.  Trachea midline. Pulmonary: He speaks in full sentences, no accessory muscle use noted. Cardiovascular: Regular rate and rhythm.  Extremities are warm. Extremities: Swelling noted to the right upper extremity, primarily in the bicep.  No erythema.  Slight tenderness to palpation medial bicep.  Wt Readings from Last 5 Encounters:  10/18/24 81 kg (178 lb 8 oz)  10/10/24 81.6 kg (179 lb 12.8 oz)  10/03/24 81.2 kg (179 lb)  09/21/24 81.7 kg (180 lb 3.2 oz)  09/06/24 79.8 kg (176 lb)    Results:   Results for orders placed or performed in visit on 10/18/24  Comprehensive Metabolic Panel   Collection Time: 10/18/24  9:05 AM  Result Value Ref Range   Sodium 140 136 - 145 mmol/L   Potassium 3.8 3.4 - 4.5 mmol/L   Chloride 106 98 - 107 mmol/L   CO2 27 21 - 31 mmol/L   Anion Gap 7 6 - 14 mmol/L   Glucose, Random 103 (H) 70 - 99 mg/dL   Blood Urea Nitrogen (BUN) 22 7 - 25 mg/dL   Creatinine 9.19 9.29 - 1.30 mg/dL   eGFR >09 >40 fO/fpw/8.26f7   Albumin 3.3 (L) 3.5 - 5.7 g/dL   Total Protein 6.6 6.4 - 8.9 g/dL   Bilirubin, Total 0.9 0.3 - 1.0 mg/dL   Alkaline Phosphatase (ALP) 119 (H) 34 - 104 U/L   Aspartate Aminotransferase (AST) 10 (L) 13 - 39 U/L   Alanine Aminotransferase (ALT) 16 7 - 52 U/L   Calcium  8.2 (L) 8.6 - 10.3 mg/dL   BUN/Creatinine Ratio     Corrected Calcium  8.8 mg/dL  Magnesium   Collection Time: 10/18/24  9:05 AM  Result Value Ref Range   Magnesium  1.7 (L) 1.9 - 2.7 mg/dL  CBC with Differential   Collection Time: 10/18/24  9:05 AM  Result Value Ref Range   WBC 2.02 (L) 4.40 - 11.00 10*3/uL   RBC 4.21 (L) 4.50 - 5.90 10*6/uL   Hemoglobin 12.2 (L) 14.0 - 17.5 g/dL   Hematocrit 64.4 (L) 58.4 - 50.4 %   Mean Corpuscular Volume (MCV) 84.3 80.0 - 96.0 fL   Mean Corpuscular Hemoglobin (MCH) 29.0 27.5 - 33.2 pg   Mean Corpuscular Hemoglobin Conc (MCHC) 34.4 33.0 - 37.0 g/dL   Red Cell Distribution Width (RDW) 15.6 12.3 - 17.0 %   Platelet Count (PLT) 141 (L) 150 - 450 10*3/uL   Mean Platelet Volume (MPV) 8.8 6.8 - 10.2 fL   Neutrophils % 44 %   Lymphocytes % 46 %  Monocytes % 3 %   Eosinophils % 6 %   Basophils % 1 %   Neutrophils Absolute 0.90 (L) 1.80 - 7.80 10*3/uL   Lymphocytes # 0.90 (L) 1.00 - 4.80 10*3/uL   Monocytes # 0.10 0.00 - 0.80 10*3/uL   Eosinophils # 0.10 0.00 - 0.50 10*3/uL   Basophils # 0.00 0.00 - 0.20 10*3/uL    Radiology:   PET/CT Skull Base to Mid Thigh Narrative: PET/CT SKULL BASE TO MID THIGH, 10/03/2024 1:35 PM  INDICATION: lung cancer, Malignant neoplasm of upper lobe, right bronchus or lung (CMD) \ C34.11 Malignant neoplasm of upper lobe, right bronchus or lung (CMD) \ C79.31 Secondary malignant neoplasm of brain    (CMD) \ C34.90 Malignant neoplasm of unspecified part of unspecified bronchus or lung    (CMD)  ADDITIONAL HISTORY: Palliative whole brain radiotherapy, 3000 cGy, completed 09/07/2024.  COMPARISON: PET/CT 06/28/2024.  TECHNIQUE: 62 minutes after the intravenous injection of 12.8 mCi F-18 FDG, PET imaging was obtained from the skull base through the mid thighs. These images were then attenuation corrected using low-dose CT and co-registered for anatomic localization. Standardized uptake values (SUV) were calculated using a lean body mass algorithm. Blood glucose at the time of injection was 100 mg/dL.  All CT scans at [facility] and [facility] are  performed using radiation dose optimization techniques as appropriate to a performed exam, including but not limited to one or more of the following: automatic exposure control, adjustment of the mA and/or kV according to patient size, use of iterative reconstruction technique. In addition, our institution participates in a radiation dose monitoring program to optimize patient radiation exposure.  SUVmax of blood pool: 1.8. SUVmax of liver: 2.2.  LIMITATIONS: None.  FINDINGS:   DISEASE-RELATED FINDINGS: *  Interval increase in size of hypermetabolic right perihilar mass with further infiltration into the mediastinum, currently measuring approximately 7.7 x 6.4 cm with SUV max 18, previously 4 x 3 cm with SUV max 17. *  Interval increase in number, size and FDG avidity of hypermetabolic mediastinal lymphadenopathy, for example a subcarinal lymphadenopathy on image 68 measuring 2.7 x 2 x 1 cm with SUV max 13, previously 1.3 x 1.1 cm with SUV max 5.3 and a prevascular lymphadenopathy on image 48 measuring 1.4 x 1 cm with SUV max 11, previously 0.7 x 0.6 cm with SUV max 1.5. *  Interval increase in size and FDG avidity of right supraclavicular lymphadenopathy (image 37) measuring 2.1 x 1.2 cm with SUV max 14, previously 1.1 x 1 cm with SUV max 7.2. *  Interval development of hypermetabolic left supraclavicular lymphadenopathy (image 41) measuring 9 mm with SUV max 7.7. *  Interval development of hypermetabolic left hilar lymphadenopathy (image 64) with SUV max up to 7.2. *  Interval development of hypermetabolic right middle lobe and right lower lobe pulmonary nodules measuring 7 mm with SUV max up to 3.4 (image 74). *  Interval increase in size and FDG avidity of hypermetabolic nodule in the right inferior abdominal rectus muscle (image 143) with SUV max 7.1, previously 1.9. *  Interval development of hypermetabolic subcutaneous nodule in the left posterior shoulder (image 34) with SUV max 3. *   Interval development of slightly hypermetabolic focus along the posterior right parotid tail (image 23) with SUV max 3.1 and no discrete CT abnormalities.  OTHER TRACER-RELATED FINDINGS: *  Expected physiologic activity within the kidneys, ureter, bladder, oropharynx, salivary glands, stomach, bowel, and brain.  *  Polyarticular degenerative uptake.  ANCILLARY CT FINDINGS: *  Right subclavian approach portacatheter with the distal tip terminating in the right atrium. *  Mild coronary artery calcifications. *  Scattered bilateral pulmonary nodules similar to prior with low-level uptake. *  Left adrenal nodule, favored adenoma. *  There is a 6 mm calculus in the right proximal ureter (image 109) without hydronephrosis. Scattered colonic diverticula. *  Prostatomegaly. *  Right fat-containing inguinal hernia. Impression: CONCLUSION: 1.  Findings most consistent with disease progression as demonstrated by increased size and FDG avidity of the primary right perihilar mass, mediastinal, bilateral hilar and supraclavicular nodal metastatic disease, pulmonary metastatic disease and subcutaneous/muscular metastatic disease as described above. 2.  Ancillary CT findings as above.    Assessment and Plan:  1.  Extensive stage small cell lung cancer: We reviewed his labs today.  We reviewed his PET CT scan from 10/03/2024.  This is baseline prior to starting treatment.  Overall I am pleased with tolerance to treatment.  Right upper extremity ultrasound Doppler to rule out deep vein thrombosis.  I am highly suspicious of a DVT based on his exam, with swelling starting yesterday.  Could be due to his port as this is on the same side as his port but could also be due to malignant lymphadenopathy in the supraclavicular region.  Lab only visit in 1 week and I will see him back in 2 weeks for labs and toxicity assessment prior to cycle 2 chemoimmunotherapy.  Plan 4 cycles of chemoimmunotherapy followed by restaging  imaging and if no evidence of disease progression, plan maintenance atezolizumab.  I called in a calcium  and magnesium supplement for him.  Potassium level looked good today.  Maintenance lurbinectedin plus atezolizumab has been shown to significantly improve progression free survival and overall survival compared to atezolizumab alone in patients with extensive stage small cell lung cancer based on the phase 3 IMforte trial.  Patient is in this study did not have brain metastases.  I will review the literature to make sure that he would be eligible for lurbinectedin.  At this time it does appear as though [facility] recommends maintenance lurbinectedin plus atezolizumab for patients with at least stable disease after induction chemoimmunotherapy, ECOG 0-1 and no history of brain metastases.  He may not be eligible for the lurbinectedin.  In total, 30 minutes of time was spent on this encounter, greater than 50% of which was spent face-to-face with Jenene Jama [place].  Non-face-to-face activities may include but are not necessarily limited to reviewing medical records, entering orders, reviewing and signing treatment/chemotherapy orders, reviewing and interpreting imaging studies and labs, as well as medical documentation.  This document was created using the aid of voice recognition Dragon dictation software, please excuse any sound alike substitutions, typographic or transcription errors.  Efforts have been made to correct these dictation errors, however some may persist, and this does not reflect the standard of medical care.  If there are any questions please do not hesitate to contact me for clarification.        [1]  Current Outpatient Medications:    albuterol  2.5 mg /3 mL (0.083 %) nebulizer solution, Take 3 mL (2.5 mg total) by nebulization every 4 (four) hours as needed for wheezing or shortness of breath., Disp: 75 mL, Rfl: 3   albuterol  HFA (PROVENTIL  HFA;VENTOLIN  HFA;PROAIR  HFA) 90  mcg/actuation inhaler, INHALE 2 PUFFS BY MOUTH EVERY 4 HOURS, Disp: 18 g, Rfl: 0   aspirin  81 mg chewable tablet, Chew  81 mg daily., Disp: , Rfl:    atorvastatin  (LIPITOR ) 80 mg tablet, Take 80 mg by mouth daily., Disp: , Rfl:    fluticasone  propionate (FLONASE ) 50 mcg/spray nasal spray, Administer 2 sprays into each nostril daily., Disp: 16 g, Rfl: 11   furosemide (LASIX) 20 mg tablet, Take 20 mg by mouth daily as needed., Disp: 30 tablet, Rfl: 2   icosapent  ethyL 1 gram cap, Take 2 g by mouth 2 (two) times a day., Disp: , Rfl:    ipratropium-albuteroL  (DUO-NEB) 0.5-2.5 mg/3 mL nebulizer solution, Inhale 3 mL., Disp: , Rfl:    levothyroxine  (SYNTHROID ) 112 mcg tablet, Take 1 tablet (112 mcg total) by mouth in the morning., Disp: 90 tablet, Rfl: 3   memantine (NAMENDA) 10 mg tablet, Take 1 tablet (10 mg) by mouth twice daily., Disp: 90 tablet, Rfl: 1   metoprolol  tartrate (LOPRESSOR ) 25 mg tablet, Take 12.5 mg by mouth 2 (two) times a day., Disp: , Rfl:    mometasone (ELOCON) 0.1 % cream, Apply topically daily. Use on treatment area twice daily. Do not apply two hours prior to treatment. (Patient taking differently: Apply topically daily. Use on treatment area twice daily. Do not apply two hours prior to treatment.), Disp: 80 g, Rfl: 0   nitroglycerin  (NITROSTAT ) 0.4 mg SL tablet, Place 0.4 mg under the tongue every 5 (five) minutes as needed for chest pain., Disp: , Rfl:    omeprazole (PriLOSEC) 20 mg DR capsule, Take 1 capsule (20 mg total) by mouth daily. (Patient taking differently: Take 20 mg by mouth daily.), Disp: 90 capsule, Rfl: 3   prochlorperazine (COMPAZINE) 10 mg tablet, Take 1 tablet (10 mg total) by mouth every 6 (six) hours as needed for nausea or vomiting., Disp: 30 tablet, Rfl: 2   ticagrelor  (Brilinta ) 90 mg tablet, Take 90 mg by mouth 2 (two) times a day., Disp: , Rfl:    ezetimibe  (ZETIA ) 10 mg tablet, Take 10 mg by mouth daily., Disp: , Rfl:

## 2024-10-19 ENCOUNTER — Emergency Department (HOSPITAL_COMMUNITY)
Admission: EM | Admit: 2024-10-19 | Discharge: 2024-10-19 | Disposition: A | Discharge status: Home / Self Care | Attending: Emergency Medicine | Admitting: Emergency Medicine

## 2024-10-19 ENCOUNTER — Encounter (HOSPITAL_COMMUNITY): Payer: Self-pay

## 2024-10-19 ENCOUNTER — Other Ambulatory Visit: Payer: Self-pay

## 2024-10-19 DIAGNOSIS — M79601 Pain in right arm: Secondary | ICD-10-CM | POA: Diagnosis present

## 2024-10-19 DIAGNOSIS — M542 Cervicalgia: Secondary | ICD-10-CM | POA: Insufficient documentation

## 2024-10-19 DIAGNOSIS — I82621 Acute embolism and thrombosis of deep veins of right upper extremity: Secondary | ICD-10-CM

## 2024-10-19 DIAGNOSIS — Z7901 Long term (current) use of anticoagulants: Secondary | ICD-10-CM | POA: Diagnosis not present

## 2024-10-19 DIAGNOSIS — I82601 Acute embolism and thrombosis of unspecified veins of right upper extremity: Secondary | ICD-10-CM | POA: Diagnosis not present

## 2024-10-19 DIAGNOSIS — Z7982 Long term (current) use of aspirin: Secondary | ICD-10-CM | POA: Insufficient documentation

## 2024-10-19 DIAGNOSIS — C3490 Malignant neoplasm of unspecified part of unspecified bronchus or lung: Secondary | ICD-10-CM | POA: Diagnosis not present

## 2024-10-19 DIAGNOSIS — Z91040 Latex allergy status: Secondary | ICD-10-CM | POA: Insufficient documentation

## 2024-10-19 LAB — CBC WITH DIFFERENTIAL/PLATELET
Basophils Absolute: 0 K/uL (ref 0.0–0.1)
Basophils Relative: 0 %
Eosinophils Absolute: 0 K/uL (ref 0.0–0.5)
Eosinophils Relative: 2 %
HCT: 34.5 % — ABNORMAL LOW (ref 39.0–52.0)
Hemoglobin: 11.3 g/dL — ABNORMAL LOW (ref 13.0–17.0)
Lymphocytes Relative: 72 %
Lymphs Abs: 1.3 K/uL (ref 0.7–4.0)
MCH: 28.5 pg (ref 26.0–34.0)
MCHC: 32.8 g/dL (ref 30.0–36.0)
MCV: 86.9 fL (ref 80.0–100.0)
Monocytes Absolute: 0.1 K/uL (ref 0.1–1.0)
Monocytes Relative: 5 %
Neutro Abs: 0.4 K/uL — CL (ref 1.7–7.7)
Neutrophils Relative %: 21 %
Platelets: 117 K/uL — ABNORMAL LOW (ref 150–400)
RBC: 3.97 MIL/uL — ABNORMAL LOW (ref 4.22–5.81)
RDW: 14.6 % (ref 11.5–15.5)
Smear Review: NORMAL
WBC: 1.8 K/uL — ABNORMAL LOW (ref 4.0–10.5)
nRBC: 0 % (ref 0.0–0.2)

## 2024-10-19 LAB — COMPREHENSIVE METABOLIC PANEL WITH GFR
ALT: 24 U/L (ref 0–44)
AST: 16 U/L (ref 15–41)
Albumin: 3.8 g/dL (ref 3.5–5.0)
Alkaline Phosphatase: 127 U/L — ABNORMAL HIGH (ref 38–126)
Anion gap: 13 (ref 5–15)
BUN: 16 mg/dL (ref 8–23)
CO2: 23 mmol/L (ref 22–32)
Calcium: 8.6 mg/dL — ABNORMAL LOW (ref 8.9–10.3)
Chloride: 105 mmol/L (ref 98–111)
Creatinine, Ser: 0.84 mg/dL (ref 0.61–1.24)
GFR, Estimated: >60 mL/min (ref >60)
Glucose, Bld: 94 mg/dL (ref 70–99)
Potassium: 3.7 mmol/L (ref 3.5–5.1)
Sodium: 141 mmol/L (ref 135–145)
Total Bilirubin: 0.4 mg/dL (ref 0.0–1.2)
Total Protein: 6.5 g/dL (ref 6.5–8.1)

## 2024-10-19 MED ORDER — HYDROCODONE-ACETAMINOPHEN 5-325 MG PO TABS: 1.0000 | ORAL_TABLET | ORAL | 0 refills | Status: AC | PRN

## 2024-10-19 MED ORDER — HYDROCODONE-ACETAMINOPHEN 5-325 MG PO TABS
1.0000 | ORAL_TABLET | Freq: Once | ORAL | Status: AC
  Administered 2024-10-19: 1 via ORAL
  Filled 2024-10-19: qty 1

## 2024-10-19 NOTE — ED Triage Notes (Signed)
 Pt arrived via POV c/o stiff neck, right arm numbness and swelling and reports bruising began on his chest that began today. Pt denies CP, denies SOB at this time and reports having confirmed blood clots from a recent ultrasound study. Pt reports being started on a regimen of xerelto yesterday.

## 2024-10-19 NOTE — ED Provider Notes (Signed)
 [place] EMERGENCY DEPARTMENT AT [facility] Provider Note   CSN: [id number] Arrival date & time: 10/19/24  1649     Patient presents with: arm numbness   Bruce Mcgee is a 68 y.o. male.   HPI 68 year old male presents with right neck and arm pain.  Symptoms originally started 2 days ago.  He is currently being treated for lung cancer with diffuse metastases including his brain.  He has a right-sided chest port.  Due to this pain and swelling in his neck his oncologist ordered a DVT ultrasound yesterday and he was found to have clot including in his internal jugular vein, subclavian vein and axillary vein segments.  He has been having pain which is his primary concern today.  He has been receiving radiation and chemotherapy.  The patient states that if he holds his arm in certain positions his fingers get tingly but otherwise they are not numb.  There is no weakness.  He denies any chest pain or shortness of breath.  Prior to Admission medications   Medication Sig Start Date End Date Taking? Authorizing Provider  HYDROcodone -acetaminophen  (NORCO/VICODIN) 5-325 MG tablet Take 1 tablet by mouth every 4 (four) hours as needed for severe pain (pain score 7-10). 10/19/24  Yes Freddi Hamilton, MD  acetaminophen  (TYLENOL ) 500 MG tablet Take 1,000 mg by mouth every 6 (six) hours as needed for mild pain.    [provider]  albuterol  (PROVENTIL  HFA;VENTOLIN  HFA) 108 (90 Base) MCG/ACT inhaler Inhale 2 puffs into the lungs every 4 (four) hours as needed for wheezing or shortness of breath.  02/18/18   [provider]  aspirin  81 MG chewable tablet Chew 1 tablet (81 mg total) by mouth daily. 03/05/22   Johnson, Kathleen R, PA-C  atorvastatin  (LIPITOR ) 80 MG tablet Take 1 tablet by mouth once daily 08/01/24   Court Dorn PARAS, MD  azithromycin  (ZITHROMAX ) 500 MG tablet Take 1 tablet (500 mg total) by mouth daily. 07/06/24   Evonnie Lenis, MD  Dextromethorphan  HBr (DELSYM  PO)  Take 10 mLs by mouth as needed (cough).    [provider]  ezetimibe  (ZETIA ) 10 MG tablet Take 1 tablet by mouth once daily 07/18/24   Court Dorn PARAS, MD  fluticasone  (FLONASE ) 50 MCG/ACT nasal spray Place 2 sprays into both nostrils as needed for allergies or rhinitis. 11/12/23 11/11/24  [provider]  guaifenesin  (ROBITUSSIN) 100 MG/5ML syrup Take 200 mg by mouth 3 (three) times daily as needed for cough.    [provider]  ipratropium-albuterol  (DUONEB) 0.5-2.5 (3) MG/3ML SOLN Take 3 mLs by nebulization 3 (three) times daily. 07/04/24   Evonnie Lenis, MD  levothyroxine  (SYNTHROID ) 112 MCG tablet Take 112 mcg by mouth every morning. 08/23/24   [provider]  memantine (NAMENDA) 10 MG tablet Take 10 mg by mouth 2 (two) times daily. 10/11/24 04/11/25  [provider]  metoprolol  tartrate (LOPRESSOR ) 25 MG tablet Take 1/2 (one-half) tablet by mouth twice daily Patient taking differently: Take 12.5 mg by mouth 2 (two) times daily. 05/10/24   Court Dorn PARAS, MD  mometasone (ELOCON) 0.1 % cream Apply 1 Application topically in the morning and at bedtime. Do not apply 2 hours prior to treatment 09/09/24   [provider]  nitroGLYCERIN  (NITROSTAT ) 0.4 MG SL tablet Place 1 tablet (0.4 mg total) under the tongue every 5 (five) minutes as needed for chest pain. 11/17/23   Court Dorn PARAS, MD  omeprazole (PRILOSEC) 20 MG capsule Take 20  mg by mouth daily. 12/06/21   [provider]  predniSONE  (DELTASONE ) 10 MG tablet Take 6 tablets (60 mg total) by mouth daily with breakfast. And decrease by one tablet daily 07/06/24   Tat, Alm, MD  prochlorperazine (COMPAZINE) 10 MG tablet Take 10 mg by mouth every 6 (six) hours as needed for nausea or vomiting. 09/21/24 10/21/24  [provider]  Rivaroxaban (XARELTO) 15 MG TABS tablet Take 15 mg by mouth 2 (two) times daily with a meal. For 21 days 10/18/24 11/08/24  [provider]  ticagrelor   (BRILINTA ) 60 MG TABS tablet Take 1 tablet (60 mg total) by mouth 2 (two) times daily. 07/04/24   Evonnie Alm, MD  VASCEPA  1 g capsule Take 2 capsules (2 g total) by mouth 2 (two) times daily. 05/09/24   Court Dorn PARAS, MD    Allergies: Adhesive [tape] and Latex    Review of Systems  Respiratory:  Negative for shortness of breath.   Cardiovascular:  Negative for chest pain.  Musculoskeletal:  Positive for neck pain.  Neurological:  Positive for numbness. Negative for weakness.    Updated Vital Signs BP 123/77   Pulse 92   Temp 99.1 F (37.3 C) (Oral)   Resp 20   Ht 5' 9.5 (1.765 m)   Wt 80.7 kg   SpO2 96%   BMI 25.91 kg/m   Physical Exam Vitals and nursing note reviewed.  Constitutional:      Appearance: He is well-developed.  HENT:     Head: Normocephalic and atraumatic.  Neck:   Cardiovascular:     Rate and Rhythm: Normal rate and regular rhythm.     Pulses:          Radial pulses are 2+ on the right side.     Heart sounds: Normal heart sounds.  Pulmonary:     Effort: Pulmonary effort is normal.     Breath sounds: Normal breath sounds.  Abdominal:     Palpations: Abdomen is soft.     Tenderness: There is no abdominal tenderness.  Musculoskeletal:     Comments: Right arm, especially upper arm is diffusely and mildly swollen compared to the left.  Some mild dark discoloration.  No pain out of proportion or compartment syndrome. Grossly normal sensation/strength in right hand.  Skin:    General: Skin is warm and dry.  Neurological:     Mental Status: He is alert.     (all labs ordered are listed, but only abnormal results are displayed) Labs Reviewed  CBC WITH DIFFERENTIAL/PLATELET - Abnormal; Notable for the following components:      Result Value   WBC 1.8 (*)    RBC 3.97 (*)    Hemoglobin 11.3 (*)    HCT 34.5 (*)    Platelets 117 (*)    Neutro Abs 0.4 (*)    All other components within normal limits  COMPREHENSIVE METABOLIC PANEL WITH GFR - Abnormal;  Notable for the following components:   Calcium  8.6 (*)    Alkaline Phosphatase 127 (*)    All other components within normal limits    EKG: EKG Interpretation Date/Time:  Wednesday October 19 2024 17:03:21 EST Ventricular Rate:  105 PR Interval:  166 QRS Duration:  98 QT Interval:  352 QTC Calculation: 465 R Axis:   96  Text Interpretation: Sinus tachycardia Rightward axis Incomplete right bundle branch block Confirmed by Freddi Hamilton [phone]) on 10/19/2024 7:52:45 PM  Radiology: No results found.   Procedures  Medications Ordered in the ED  HYDROcodone -acetaminophen  (NORCO/VICODIN) 5-325 MG per tablet 1 tablet (1 tablet Oral Given 10/19/24 2039)                                    Medical Decision Making Amount and/or Complexity of Data Reviewed External Data Reviewed: labs, radiology and notes.    Details: Right upper extremity DVT study shows extensive clot Labs:     Details: Pancytopenia, similar to Care Everywhere labs  Risk Prescription drug management.   Patient presents with pain from his DVT that was diagnosed yesterday but originally started 2 days ago.  The symptoms are not actually worse, just not any better.  He has not taking anything for the symptoms.  He has some tingling in his fingers but no true numbness.  Strong radial pulse.  No signs or symptoms of PE.  Discussed I could consider talking to vascular surgery but at this point he just wants to go home with some pain control and follow-up with his oncologist.  He has already taken 3 doses of his anticoagulation.  I think this is pretty reasonable and discussed he will need to talk to oncology about potentially taking the port out as this might be the cause.  Will give some hydrocodone  here and discharged with short course of pain control and he can follow-up with oncology.     Final diagnoses:  Acute deep vein thrombosis (DVT) of right upper extremity, unspecified vein [facility])    ED Discharge  Orders          Ordered    HYDROcodone -acetaminophen  (NORCO/VICODIN) 5-325 MG tablet  Every 4 hours PRN        10/19/24 2034               Freddi Hamilton, MD 10/19/24 2153

## 2024-10-19 NOTE — Discharge Instructions (Addendum)
 Follow-up with your oncologist, call their office tomorrow.  We are treating you with hydrocodone  for severe pain.  You may still take Tylenol  for pain, but have caution as the hydrocodone  has some Tylenol  in it and you can only take a total of 4000 mg/day total.  Do not take NSAID such as ibuprofen , Advil , etc. as you are on the blood thinner.  If you develop chest pain, shortness of breath, new or worsening pain or swelling in your arm, or any other new/concerning symptoms then return to the ER.

## 2024-11-28 ENCOUNTER — Other Ambulatory Visit: Payer: Self-pay

## 2024-11-29 ENCOUNTER — Other Ambulatory Visit: Payer: Self-pay | Admitting: Cardiovascular Disease

## 2024-12-05 NOTE — Telephone Encounter (Signed)
 Lipid panel 05/17/24, CBC 11/22/24 (Hgb10.5 and Hct 31.4), PLT 10/19/24 (117)  Will see if provider wants to refill Brilinta  with low H&H and PLT.

## 2024-12-06 MED ORDER — OMEPRAZOLE 20 MG PO CPDR: 20.0000 mg | DELAYED_RELEASE_CAPSULE | Freq: Every day | ORAL | 0 refills | Status: AC

## 2024-12-06 MED ORDER — TICAGRELOR 60 MG PO TABS: 60.0000 mg | ORAL_TABLET | Freq: Two times a day (BID) | ORAL | 0 refills | Status: AC

## 2024-12-06 MED ORDER — VASCEPA 1 G PO CAPS: 2.0000 g | ORAL_CAPSULE | Freq: Two times a day (BID) | ORAL | 0 refills | Status: AC

## 2024-12-06 NOTE — Telephone Encounter (Signed)
 Lipids completed on 05/17/24. Labs on 10/31/24 Outside of Normal  In accordance with refill protocols, please review and address the following requirements before this medication refill can be authorized:  Labs

## 2025-01-09 ENCOUNTER — Ambulatory Visit: Admitting: Cardiovascular Disease
